# Patient Record
Sex: Female | Born: 1940 | Race: White | Hispanic: No | Smoking: Never smoker
Health system: Southern US, Community
[De-identification: ages and names within clinical notes are randomized; demographics above are authoritative.]

## PROBLEM LIST (undated history)

## (undated) DIAGNOSIS — N183 Chronic kidney disease, stage 3 unspecified: Secondary | ICD-10-CM

## (undated) DIAGNOSIS — T7840XA Allergy, unspecified, initial encounter: Secondary | ICD-10-CM

## (undated) DIAGNOSIS — E039 Hypothyroidism, unspecified: Secondary | ICD-10-CM

## (undated) DIAGNOSIS — E079 Disorder of thyroid, unspecified: Secondary | ICD-10-CM

## (undated) DIAGNOSIS — M1990 Unspecified osteoarthritis, unspecified site: Secondary | ICD-10-CM

## (undated) DIAGNOSIS — D689 Coagulation defect, unspecified: Secondary | ICD-10-CM

## (undated) DIAGNOSIS — R519 Headache, unspecified: Secondary | ICD-10-CM

## (undated) DIAGNOSIS — A6920 Lyme disease, unspecified: Secondary | ICD-10-CM

## (undated) DIAGNOSIS — F329 Major depressive disorder, single episode, unspecified: Secondary | ICD-10-CM

## (undated) DIAGNOSIS — I2699 Other pulmonary embolism without acute cor pulmonale: Secondary | ICD-10-CM

## (undated) DIAGNOSIS — M353 Polymyalgia rheumatica: Secondary | ICD-10-CM

## (undated) DIAGNOSIS — K219 Gastro-esophageal reflux disease without esophagitis: Secondary | ICD-10-CM

## (undated) DIAGNOSIS — K529 Noninfective gastroenteritis and colitis, unspecified: Secondary | ICD-10-CM

## (undated) DIAGNOSIS — I8290 Acute embolism and thrombosis of unspecified vein: Secondary | ICD-10-CM

## (undated) DIAGNOSIS — R51 Headache: Secondary | ICD-10-CM

## (undated) DIAGNOSIS — M797 Fibromyalgia: Secondary | ICD-10-CM

## (undated) DIAGNOSIS — G9050 Complex regional pain syndrome I, unspecified: Secondary | ICD-10-CM

## (undated) DIAGNOSIS — F419 Anxiety disorder, unspecified: Secondary | ICD-10-CM

## (undated) DIAGNOSIS — E785 Hyperlipidemia, unspecified: Secondary | ICD-10-CM

## (undated) DIAGNOSIS — F32A Depression, unspecified: Secondary | ICD-10-CM

## (undated) DIAGNOSIS — M810 Age-related osteoporosis without current pathological fracture: Secondary | ICD-10-CM

## (undated) DIAGNOSIS — I10 Essential (primary) hypertension: Secondary | ICD-10-CM

## (undated) DIAGNOSIS — I809 Phlebitis and thrombophlebitis of unspecified site: Secondary | ICD-10-CM

## (undated) HISTORY — DX: Anxiety disorder, unspecified: F41.9

## (undated) HISTORY — DX: Chronic kidney disease, stage 3 (moderate): N18.3

## (undated) HISTORY — DX: Major depressive disorder, single episode, unspecified: F32.9

## (undated) HISTORY — DX: Hyperlipidemia, unspecified: E78.5

## (undated) HISTORY — DX: Depression, unspecified: F32.A

## (undated) HISTORY — DX: Hypothyroidism, unspecified: E03.9

## (undated) HISTORY — DX: Chronic kidney disease, stage 3 unspecified: N18.30

## (undated) HISTORY — DX: Allergy, unspecified, initial encounter: T78.40XA

## (undated) HISTORY — DX: Coagulation defect, unspecified: D68.9

## (undated) HISTORY — DX: Age-related osteoporosis without current pathological fracture: M81.0

## (undated) HISTORY — PX: THROMBECTOMY / EMBOLECTOMY VENA CAVA: SUR1356

## (undated) HISTORY — DX: Polymyalgia rheumatica: M35.3

## (undated) HISTORY — PX: DILATION AND CURETTAGE OF UTERUS: SHX78

## (undated) HISTORY — DX: Gastro-esophageal reflux disease without esophagitis: K21.9

## (undated) HISTORY — PX: EYE SURGERY: SHX253

## (undated) HISTORY — PX: APPENDECTOMY: SHX54

## (undated) HISTORY — PX: CHOLECYSTECTOMY: SHX55

## (undated) HISTORY — PX: BACK SURGERY: SHX140

## (undated) HISTORY — PX: OTHER SURGICAL HISTORY: SHX169

---

## 1970-09-25 DIAGNOSIS — I809 Phlebitis and thrombophlebitis of unspecified site: Secondary | ICD-10-CM

## 1970-09-25 HISTORY — DX: Phlebitis and thrombophlebitis of unspecified site: I80.9

## 2010-03-22 ENCOUNTER — Inpatient Hospital Stay (HOSPITAL_COMMUNITY): Admission: EM | Admit: 2010-03-22 | Discharge: 2010-03-26 | Payer: Self-pay | Admitting: Emergency Medicine

## 2010-04-04 ENCOUNTER — Ambulatory Visit: Payer: Self-pay | Admitting: Internal Medicine

## 2010-04-04 DIAGNOSIS — K219 Gastro-esophageal reflux disease without esophagitis: Secondary | ICD-10-CM

## 2010-04-04 DIAGNOSIS — J40 Bronchitis, not specified as acute or chronic: Secondary | ICD-10-CM | POA: Insufficient documentation

## 2010-04-05 DIAGNOSIS — I10 Essential (primary) hypertension: Secondary | ICD-10-CM | POA: Insufficient documentation

## 2010-04-05 DIAGNOSIS — I80299 Phlebitis and thrombophlebitis of other deep vessels of unspecified lower extremity: Secondary | ICD-10-CM | POA: Insufficient documentation

## 2010-04-05 DIAGNOSIS — J45909 Unspecified asthma, uncomplicated: Secondary | ICD-10-CM | POA: Insufficient documentation

## 2010-04-11 ENCOUNTER — Encounter: Payer: Self-pay | Admitting: Internal Medicine

## 2010-04-11 ENCOUNTER — Ambulatory Visit (HOSPITAL_COMMUNITY): Admission: RE | Admit: 2010-04-11 | Discharge: 2010-04-11 | Payer: Self-pay | Admitting: Internal Medicine

## 2010-10-25 NOTE — Assessment & Plan Note (Signed)
Summary: ASTHMA/ MBW   Primary Provider/Referring Provider:  Gerri Spore  CC:  Asthma consult-Dr. Gerri Spore.  History of Present Illness: April 04, 2010- 70 yoF seen on kind referral by Dr Gerri Spore for asthma. Here with sister-in-law. Never smoked, but lived with a smoker for 20 years. She had dealt with a chestcold/ bronchitis this Spring. It got worse after a plane flight and never quite cleared with antibiotics. In late June she was hospitalized with bronchitis. Now better but still not fully clear- hoarse, some cough. She is now finishing a prednisone taper. She is getting ready to go to [place] for a couple of months. She reports regular exercise- swimming, yoga- but these have been on hold since the hospital.  She has a nebulizer with albuterol/ ipratropium which she is trying to use 4 x/ daily, but it makes her shake. Given pulmicort inhaler she didn't try because she was afraid  of the steroid.  This week having significant heartburn at the xiphoid. Hx PE age 34 w/ gallbladder surgery, then DVT in 2004 with Lyme Disease= she says she had an inguinal filter placed.  Preventive Screening-Counseling & Management  Alcohol-Tobacco     Smoking Status: never     Passive Smoke Exposure: yes  Comments: Passive smoking during the years she was married.  Current Medications (verified): 1)  Coumadin 5 Mg Tabs (Warfarin Sodium) .... Take As Directed 2)  Levothroid 88 Mcg Tabs (Levothyroxine Sodium) .... Take 1 By Mouth Once Daily 3)  Verapamil Hcl 120 Mg Tabs (Verapamil Hcl) .... Take 1 By Mouth Two Times A Day 4)  Celexa 10 Mg Tabs (Citalopram Hydrobromide) .... Take 30mg  Daily 5)  Albuterol Sulfate (2.5 Mg/61ml) 0.083% Nebu (Albuterol Sulfate) .Marland Kitchen.. 1 Vial Every 6 Hours As Needed 6)  Ipratropium Bromide 0.02 % Soln (Ipratropium Bromide) .Marland Kitchen.. 1 Vial Every 6 Hours As Needed 7)  Prednisone 20 Mg Tabs (Prednisone) .... Taper Dose  Allergies (verified): 1)  ! Codeine 2)  ! * Quin Abx's 3)   ! Bactrim 4)  ! * Ivp Dye  Past History:  Past Medical History: Asthma Deep Vein Thrombosis/Phlebitis - Iliac/ vena cava- PE age 33, DVT 2004 Hypertension  Past Surgical History: Appendectomy Cholecystectomy Tubal ligation Ilieofemoral thrombectomy  Family History: Emphysema-mohther Asthma-sisters Cancer-daughter(breast to brain).  Social History: Divorced; children Lives Alone Retired Non SmokerSmoking Status:  never  Vital Signs:  Patient profile:   70 year old female Weight:      151.31 pounds O2 Sat:      97 % on Room air Pulse rate:   80 / minute BP sitting:   140 / 82  (left arm) Cuff size:   regular  Vitals Entered By: Reynaldo Minium CMA (April 04, 2010 9:14 AM)  O2 Flow:  Room air CC: Asthma consult-Dr. Gerri Spore   Physical Exam  Additional Exam:  General: A/Ox3; pleasant and cooperative, NAD, SKIN: no rash, lesions NODES: no lymphadenopathy HEENT: [place]/AT, EOM- WNL, Conjuctivae- clear, PERRLA, TM-WNL, Nose- crusty, Throat- clear and wnl, Mallampati  II, red NECK: Supple w/ fair ROM, JVD- none, normal carotid impulses w/o bruits Thyroid- normal to palpation CHEST: Clear to P&A, but occasional rattling cough HEART: RRR, no m/g/r heard ABDOMEN: Soft and nl; nml bowel sounds; no organomegaly or masses noted UXL:KGMW, nl pulses, no edema  NEURO: Grossly intact to observation      Impression & Recommendations:  Problem # 1:  BRONCHITIS NOT SPECIFIED AS ACUTE OR CHRONIC (ICD-490)  Acute on chronic bronchitis with past  hx of passive smoking. We are going to have her take the Pulmicort HFA and also try Singulair as antiinlammatories. Meanwhile we will schedule PFT in hopes of getting it before she goes to [place]. We plan to see her again in the Fall. By that time her lungs should have settled down. Her updated medication list for this problem includes:    Albuterol Sulfate (2.5 Mg/46ml) 0.083% Nebu (Albuterol sulfate) .Marland Kitchen... 1 vial every 6 hours as  needed    Ipratropium Bromide 0.02 % Soln (Ipratropium bromide) .Marland Kitchen... 1 vial every 6 hours as needed    Pulmicort Flexhaler 180 Mcg/act Aepb (Budesonide) .Marland Kitchen... 2 puffs and rinse mouth twice daily    Singulair 10 Mg Tabs (Montelukast sodium) .Marland Kitchen... 1 daily  Problem # 2:  GERD (ICD-530.81) I emphasized her GERD. She will start a trial of omeprazole. This problem may ease as she gets off prednisone. Her updated medication list for this problem includes:    Omeprazole 20 Mg Cpdr (Omeprazole) .Marland Kitchen... 1 daily before meal  Medications Added to Medication List This Visit: 1)  Coumadin 5 Mg Tabs (Warfarin sodium) .... Take as directed 2)  Levothroid 88 Mcg Tabs (Levothyroxine sodium) .... Take 1 by mouth once daily 3)  Verapamil Hcl 120 Mg Tabs (Verapamil hcl) .... Take 1 by mouth two times a day 4)  Celexa 10 Mg Tabs (Citalopram hydrobromide) .... Take 30mg  daily 5)  Albuterol Sulfate (2.5 Mg/48ml) 0.083% Nebu (Albuterol sulfate) .Marland Kitchen.. 1 vial every 6 hours as needed 6)  Ipratropium Bromide 0.02 % Soln (Ipratropium bromide) .Marland Kitchen.. 1 vial every 6 hours as needed 7)  Prednisone 20 Mg Tabs (Prednisone) .... Taper dose 8)  Omeprazole 20 Mg Cpdr (Omeprazole) .Marland Kitchen.. 1 daily before meal 9)  Pulmicort Flexhaler 180 Mcg/act Aepb (Budesonide) .... 2 puffs and rinse mouth twice daily 10)  Singulair 10 Mg Tabs (Montelukast sodium) .Marland Kitchen.. 1 daily  Other Orders: Consultation Level IV (16109)  Patient Instructions: 1)  Please schedule a follow-up appointment in 3 months. 2)  Schedule PFT 3)  Sample/ script Singulair 10 mg, 1 daily. If it seems to help you can fill script. 4)  Pulmicort 180: 2 puffs and rinse mouth, twice daily . If doing very well, after 2 weeks you can reduce to once daily. 5)  Omeprazole acid blocker- one daily before meal. 6)  Use your nebulizer up to 4 x daily, if needed.  7)  Use the Proventil rescue inhaler 2 puffs, 4 x daily if needed. Prescriptions: SINGULAIR 10 MG TABS (MONTELUKAST SODIUM) 1  daily  #30 x prn   Entered and Authorized by:   Waymon Budge MD   Signed by:   Waymon Budge MD on 04/04/2010   Method used:   Print then Give to Patient   RxID:   [id number] PULMICORT FLEXHALER 180 MCG/ACT AEPB (BUDESONIDE) 2 puffs and rinse mouth twice daily  #1 x prn   Entered and Authorized by:   Waymon Budge MD   Signed by:   Waymon Budge MD on 04/04/2010   Method used:   Print then Give to Patient   RxID:   [id number] OMEPRAZOLE 20 MG CPDR (OMEPRAZOLE) 1 daily before meal  #30 x prn   Entered and Authorized by:   Waymon Budge MD   Signed by:   Waymon Budge MD on 04/04/2010   Method used:   Print then Give to Patient   RxID:   [id number]

## 2010-12-11 LAB — COMPREHENSIVE METABOLIC PANEL
ALT: 18 U/L (ref 0–35)
BUN: 21 mg/dL (ref 6–23)
Calcium: 8.9 mg/dL (ref 8.4–10.5)
Chloride: 105 mEq/L (ref 96–112)
GFR calc Af Amer: >60 mL/min (ref >60)
Glucose, Bld: 153 mg/dL — ABNORMAL HIGH (ref 70–99)
Potassium: 4.2 mEq/L (ref 3.5–5.1)
Sodium: 139 mEq/L (ref 135–145)

## 2010-12-11 LAB — GLUCOSE, CAPILLARY
Glucose-Capillary: 122 mg/dL — ABNORMAL HIGH (ref 70–99)
Glucose-Capillary: 152 mg/dL — ABNORMAL HIGH (ref 70–99)
Glucose-Capillary: 157 mg/dL — ABNORMAL HIGH (ref 70–99)

## 2010-12-11 LAB — CBC
HCT: 35.4 % — ABNORMAL LOW (ref 36.0–46.0)
Hemoglobin: 12 g/dL (ref 12.0–15.0)
MCV: 94.1 fL (ref 78.0–100.0)
Platelets: 266 10*3/uL (ref 150–400)

## 2010-12-11 LAB — PROTIME-INR: INR: 2.51 — ABNORMAL HIGH (ref 0.00–1.49)

## 2010-12-12 LAB — CULTURE, BLOOD (ROUTINE X 2): Culture: NO GROWTH

## 2010-12-12 LAB — CARDIAC PANEL(CRET KIN+CKTOT+MB+TROPI)
CK, MB: 1.2 ng/mL (ref 0.3–4.0)
CK, MB: 1.5 ng/mL (ref 0.3–4.0)
Relative Index: INVALID (ref 0.0–2.5)
Total CK: 25 U/L (ref 7–177)
Total CK: 30 U/L (ref 7–177)
Total CK: 32 U/L (ref 7–177)

## 2010-12-12 LAB — BASIC METABOLIC PANEL
BUN: 15 mg/dL (ref 6–23)
CO2: 28 mEq/L (ref 19–32)
Chloride: 105 mEq/L (ref 96–112)
Creatinine, Ser: 0.77 mg/dL (ref 0.4–1.2)

## 2010-12-12 LAB — CBC
HCT: 39.7 % (ref 36.0–46.0)
MCH: 31.5 pg (ref 26.0–34.0)
MCH: 31.8 pg (ref 26.0–34.0)
MCHC: 33.6 g/dL (ref 30.0–36.0)
MCV: 93.8 fL (ref 78.0–100.0)
MCV: 93.9 fL (ref 78.0–100.0)
Platelets: 246 10*3/uL (ref 150–400)
RBC: 4.23 MIL/uL (ref 3.87–5.11)
RDW: 15.2 % (ref 11.5–15.5)
WBC: 6.1 10*3/uL (ref 4.0–10.5)

## 2010-12-12 LAB — BLOOD GAS, ARTERIAL
Bicarbonate: 20.5 mEq/L (ref 20.0–24.0)
Patient temperature: 98.6
TCO2: 18.6 mmol/L (ref 0–100)
pCO2 arterial: 36.4 mmHg (ref 35.0–45.0)
pH, Arterial: 7.368 (ref 7.350–7.400)

## 2010-12-12 LAB — COMPREHENSIVE METABOLIC PANEL
ALT: 19 U/L (ref 0–35)
AST: 20 U/L (ref 0–37)
CO2: 25 mEq/L (ref 19–32)
Chloride: 104 mEq/L (ref 96–112)
Glucose, Bld: 132 mg/dL — ABNORMAL HIGH (ref 70–99)
Potassium: 3.2 mEq/L — ABNORMAL LOW (ref 3.5–5.1)
Total Protein: 6.6 g/dL (ref 6.0–8.3)

## 2010-12-12 LAB — GLUCOSE, CAPILLARY

## 2010-12-12 LAB — URINE CULTURE: Culture: NO GROWTH

## 2010-12-12 LAB — DIFFERENTIAL
Basophils Absolute: 0 10*3/uL (ref 0.0–0.1)
Basophils Relative: 0 % (ref 0–1)
Eosinophils Absolute: 0.1 10*3/uL (ref 0.0–0.7)
Eosinophils Relative: 1 % (ref 0–5)
Lymphs Abs: 2.8 10*3/uL (ref 0.7–4.0)
Neutro Abs: 2.8 10*3/uL (ref 1.7–7.7)

## 2010-12-12 LAB — URINALYSIS, ROUTINE W REFLEX MICROSCOPIC
Ketones, ur: NEGATIVE mg/dL
Leukocytes, UA: NEGATIVE
Specific Gravity, Urine: 1.01 (ref 1.005–1.030)
pH: 6 (ref 5.0–8.0)

## 2010-12-12 LAB — PROTIME-INR
INR: 1.96 — ABNORMAL HIGH (ref 0.00–1.49)
INR: 2.23 — ABNORMAL HIGH (ref 0.00–1.49)
Prothrombin Time: 24.5 seconds — ABNORMAL HIGH (ref 11.6–15.2)

## 2010-12-12 LAB — LIPID PANEL
Triglycerides: 47 mg/dL (ref <150)
VLDL: 9 mg/dL (ref 0–40)

## 2010-12-12 LAB — BRAIN NATRIURETIC PEPTIDE: Pro B Natriuretic peptide (BNP): <30 pg/mL (ref 0.0–100.0)

## 2010-12-12 LAB — URINE MICROSCOPIC-ADD ON

## 2010-12-12 LAB — LACTIC ACID, PLASMA: Lactic Acid, Venous: 2.2 mmol/L (ref 0.5–2.2)

## 2010-12-12 LAB — HEMOGLOBIN A1C: Mean Plasma Glucose: 128 mg/dL — ABNORMAL HIGH (ref <117)

## 2010-12-30 ENCOUNTER — Emergency Department (HOSPITAL_BASED_OUTPATIENT_CLINIC_OR_DEPARTMENT_OTHER)
Admission: EM | Admit: 2010-12-30 | Discharge: 2010-12-30 | Disposition: A | Discharge status: Other Acute Inpatient Hospital | Payer: Medicare Other | Source: Home / Self Care | Attending: Emergency Medicine | Admitting: Emergency Medicine

## 2010-12-30 ENCOUNTER — Inpatient Hospital Stay (HOSPITAL_COMMUNITY)
Admission: EM | Admit: 2010-12-30 | Discharge: 2011-01-02 | DRG: 690 | Disposition: A | Discharge status: Home / Self Care | Payer: Medicare Other | Source: Other Acute Inpatient Hospital | Attending: Internal Medicine | Admitting: Internal Medicine

## 2010-12-30 ENCOUNTER — Emergency Department (INDEPENDENT_AMBULATORY_CARE_PROVIDER_SITE_OTHER): Payer: Medicare Other

## 2010-12-30 DIAGNOSIS — F411 Generalized anxiety disorder: Secondary | ICD-10-CM | POA: Diagnosis present

## 2010-12-30 DIAGNOSIS — R1032 Left lower quadrant pain: Secondary | ICD-10-CM

## 2010-12-30 DIAGNOSIS — J45909 Unspecified asthma, uncomplicated: Secondary | ICD-10-CM | POA: Insufficient documentation

## 2010-12-30 DIAGNOSIS — I998 Other disorder of circulatory system: Secondary | ICD-10-CM | POA: Insufficient documentation

## 2010-12-30 DIAGNOSIS — IMO0001 Reserved for inherently not codable concepts without codable children: Secondary | ICD-10-CM | POA: Diagnosis present

## 2010-12-30 DIAGNOSIS — R112 Nausea with vomiting, unspecified: Secondary | ICD-10-CM

## 2010-12-30 DIAGNOSIS — E785 Hyperlipidemia, unspecified: Secondary | ICD-10-CM | POA: Diagnosis present

## 2010-12-30 DIAGNOSIS — R109 Unspecified abdominal pain: Secondary | ICD-10-CM

## 2010-12-30 DIAGNOSIS — I82509 Chronic embolism and thrombosis of unspecified deep veins of unspecified lower extremity: Secondary | ICD-10-CM | POA: Diagnosis present

## 2010-12-30 DIAGNOSIS — Z79899 Other long term (current) drug therapy: Secondary | ICD-10-CM | POA: Insufficient documentation

## 2010-12-30 DIAGNOSIS — E039 Hypothyroidism, unspecified: Secondary | ICD-10-CM | POA: Diagnosis present

## 2010-12-30 DIAGNOSIS — N12 Tubulo-interstitial nephritis, not specified as acute or chronic: Secondary | ICD-10-CM | POA: Insufficient documentation

## 2010-12-30 DIAGNOSIS — N10 Acute tubulo-interstitial nephritis: Principal | ICD-10-CM | POA: Diagnosis present

## 2010-12-30 DIAGNOSIS — Z86718 Personal history of other venous thrombosis and embolism: Secondary | ICD-10-CM | POA: Insufficient documentation

## 2010-12-30 DIAGNOSIS — I10 Essential (primary) hypertension: Secondary | ICD-10-CM | POA: Insufficient documentation

## 2010-12-30 DIAGNOSIS — D72829 Elevated white blood cell count, unspecified: Secondary | ICD-10-CM | POA: Diagnosis present

## 2010-12-30 DIAGNOSIS — Z87442 Personal history of urinary calculi: Secondary | ICD-10-CM

## 2010-12-30 DIAGNOSIS — Z7901 Long term (current) use of anticoagulants: Secondary | ICD-10-CM

## 2010-12-30 DIAGNOSIS — G9050 Complex regional pain syndrome I, unspecified: Secondary | ICD-10-CM | POA: Diagnosis present

## 2010-12-30 LAB — CBC
MCH: 30.8 pg (ref 26.0–34.0)
MCV: 88.8 fL (ref 78.0–100.0)
Platelets: 264 10*3/uL (ref 150–400)
RBC: 5.36 MIL/uL — ABNORMAL HIGH (ref 3.87–5.11)

## 2010-12-30 LAB — URINALYSIS, ROUTINE W REFLEX MICROSCOPIC
Nitrite: POSITIVE — AB
Protein, ur: 30 mg/dL — AB
Specific Gravity, Urine: 1.014 (ref 1.005–1.030)
Urobilinogen, UA: 1 mg/dL (ref 0.0–1.0)

## 2010-12-30 LAB — BASIC METABOLIC PANEL
BUN: 25 mg/dL — ABNORMAL HIGH (ref 6–23)
Chloride: 96 mEq/L (ref 96–112)
Creatinine, Ser: 0.9 mg/dL (ref 0.4–1.2)
Glucose, Bld: 137 mg/dL — ABNORMAL HIGH (ref 70–99)

## 2010-12-30 LAB — URINE MICROSCOPIC-ADD ON

## 2010-12-30 LAB — DIFFERENTIAL
Basophils Relative: 0 % (ref 0–1)
Eosinophils Absolute: 0 10*3/uL (ref 0.0–0.7)
Monocytes Absolute: 4.6 10*3/uL — ABNORMAL HIGH (ref 0.1–1.0)
Neutro Abs: 28.6 10*3/uL — ABNORMAL HIGH (ref 1.7–7.7)

## 2010-12-30 LAB — PROTIME-INR: Prothrombin Time: 54.6 seconds — ABNORMAL HIGH (ref 11.6–15.2)

## 2010-12-31 LAB — CBC
HCT: 40.7 % (ref 36.0–46.0)
MCV: 91.1 fL (ref 78.0–100.0)
Platelets: 185 10*3/uL (ref 150–400)
RBC: 4.47 MIL/uL (ref 3.87–5.11)
RDW: 13 % (ref 11.5–15.5)
WBC: 24 10*3/uL — ABNORMAL HIGH (ref 4.0–10.5)

## 2010-12-31 LAB — BASIC METABOLIC PANEL
Calcium: 8.4 mg/dL (ref 8.4–10.5)
Chloride: 103 mEq/L (ref 96–112)
Creatinine, Ser: 0.93 mg/dL (ref 0.4–1.2)
GFR calc Af Amer: >60 mL/min (ref >60)
Sodium: 137 mEq/L (ref 135–145)

## 2010-12-31 LAB — TSH: TSH: 0.051 u[IU]/mL — ABNORMAL LOW (ref 0.350–4.500)

## 2010-12-31 LAB — LIPID PANEL
HDL: 67 mg/dL (ref >39)
Total CHOL/HDL Ratio: 2.2 RATIO
VLDL: 22 mg/dL (ref 0–40)

## 2010-12-31 LAB — PROTIME-INR: INR: 6.79 (ref 0.00–1.49)

## 2010-12-31 LAB — HEMOGLOBIN A1C
Hgb A1c MFr Bld: 5.9 % — ABNORMAL HIGH (ref <5.7)
Mean Plasma Glucose: 123 mg/dL — ABNORMAL HIGH (ref <117)

## 2010-12-31 LAB — PHOSPHORUS: Phosphorus: 3.9 mg/dL (ref 2.3–4.6)

## 2011-01-01 LAB — CBC
Hemoglobin: 12.8 g/dL (ref 12.0–15.0)
MCH: 30.8 pg (ref 26.0–34.0)
MCHC: 33.2 g/dL (ref 30.0–36.0)
Platelets: 182 10*3/uL (ref 150–400)
RDW: 13.3 % (ref 11.5–15.5)

## 2011-01-01 LAB — ABO/RH: ABO/RH(D): A NEG

## 2011-01-01 LAB — DIFFERENTIAL
Basophils Relative: 0 % (ref 0–1)
Eosinophils Absolute: 0 10*3/uL (ref 0.0–0.7)
Eosinophils Relative: 0 % (ref 0–5)
Monocytes Absolute: 1.8 10*3/uL — ABNORMAL HIGH (ref 0.1–1.0)
Monocytes Relative: 9 % (ref 3–12)

## 2011-01-01 LAB — COMPREHENSIVE METABOLIC PANEL
Alkaline Phosphatase: 53 U/L (ref 39–117)
BUN: 15 mg/dL (ref 6–23)
Creatinine, Ser: 0.76 mg/dL (ref 0.4–1.2)
Glucose, Bld: 128 mg/dL — ABNORMAL HIGH (ref 70–99)
Potassium: 4.5 mEq/L (ref 3.5–5.1)
Total Protein: 5.4 g/dL — ABNORMAL LOW (ref 6.0–8.3)

## 2011-01-01 LAB — MAGNESIUM: Magnesium: 2.2 mg/dL (ref 1.5–2.5)

## 2011-01-01 LAB — LIPASE, BLOOD: Lipase: 35 U/L (ref 11–59)

## 2011-01-01 LAB — URINE CULTURE
Culture  Setup Time: 201204070059
Culture: NO GROWTH

## 2011-01-01 LAB — PROTIME-INR: Prothrombin Time: 49.6 seconds — ABNORMAL HIGH (ref 11.6–15.2)

## 2011-01-02 LAB — BASIC METABOLIC PANEL
BUN: 12 mg/dL (ref 6–23)
Chloride: 102 mEq/L (ref 96–112)
GFR calc Af Amer: >60 mL/min (ref >60)
GFR calc non Af Amer: >60 mL/min (ref >60)
Potassium: 3.5 mEq/L (ref 3.5–5.1)
Sodium: 137 mEq/L (ref 135–145)

## 2011-01-02 LAB — CBC
Platelets: 177 10*3/uL (ref 150–400)
RBC: 4 MIL/uL (ref 3.87–5.11)
RDW: 13.1 % (ref 11.5–15.5)
WBC: 18.6 10*3/uL — ABNORMAL HIGH (ref 4.0–10.5)

## 2011-01-02 LAB — DIFFERENTIAL
Basophils Relative: 0 % (ref 0–1)
Eosinophils Absolute: 0 10*3/uL (ref 0.0–0.7)
Eosinophils Relative: 0 % (ref 0–5)
Monocytes Absolute: 1.6 10*3/uL — ABNORMAL HIGH (ref 0.1–1.0)
Monocytes Relative: 9 % (ref 3–12)

## 2011-01-02 LAB — MAGNESIUM: Magnesium: 2.1 mg/dL (ref 1.5–2.5)

## 2011-01-02 LAB — PREPARE FRESH FROZEN PLASMA: Unit division: 0

## 2011-01-05 LAB — CULTURE, BLOOD (ROUTINE X 2): Culture  Setup Time: 201204062325

## 2011-01-06 LAB — CULTURE, BLOOD (ROUTINE X 2)
Culture  Setup Time: 201204071121
Culture  Setup Time: 201204071121

## 2011-01-09 NOTE — Discharge Summary (Signed)
NAME:  Elizabeth [name], Elizabeth [name]              ACCOUNT NO.:  [id number]  MEDICAL RECORD NO.:  [id number]           PATIENT TYPE:  I  LOCATION:  5532                         FACILITY:  [facility]  PHYSICIAN:  Rock Nephew, MD       DATE OF BIRTH:  [birth year]  DATE OF ADMISSION:  12/30/2010 DATE OF DISCHARGE:  01/02/2011                        DISCHARGE SUMMARY - REFERRING   PRIMARY CARE PHYSICIAN:  Dr.  Carolin Coy at [facility].  DISCHARGE DIAGNOSES: 1. Acute left-sided pyelonephritis. 2. History of pulmonary embolism and deep vein thrombosis, on     Coumadin.  Coagulopathy and hematuria resolved. 3. History of asthma, stable. 4. Leukocytosis, resolving, combination of infection as well as     steroid use. 5. Hyperlipidemia, stable. 6. Hypothyroidism with a depressed TSH, normal free T4. 7. Hypertension, controlled. 8. Depression. 9. Reflex sympathetic dystrophy. 10.Hematuria.  DISCHARGE MEDICATIONS:  For the patient are as follows: 1. Augmentin 875 mg by mouth twice daily for 11 days. 2. Colace 100 mg by mouth twice daily. 3. Dilaudid oral 2 mg by mouth every 4 hours as needed for pain. 4. Zofran 4 mg by mouth every 6 hours as needed. 5. Prednisone taper 20 mg for 2 days, 10 mg for 2 days and stopping. 6. Albuterol nebulization 2.5 mg inhaled every 6 hours as needed. 7. Benzoate 100 mg 1 capsule by mouth 3 times a day as needed. 8. Budesonide 0.5 mg inhaled every 12 hours as needed. 9. Citalopram 30 mg 1 tablet by mouth daily. 10.Coumadin 5 mg.  The patient takes 1 mg on Monday, Wednesday, and     Friday.  She takes half tablet rest of the week. 11.Atrovent 0.5 mg inhaled every 6 hours as needed. 12.Levothroid 88 mcg 1 tablet p.o. daily. 13.Verapamil sustained release 120 mg 1 tablet by mouth twice daily.  DISPOSITION:  Home.  DIET:  Heart healthy.  FOLLOW-UP:  The patient should follow up with Dr. Carolin Coy within 1 week.  The patient should also have  PT/INR checked 2 days after discharge with Dr. Carolin Coy.  The patient should also have thyroid function test done in 5 weeks.  The patient should also continue to follow up with the orthopedic physician.  PROCEDURES PERFORMED:  The patient had a 2-view abdominal x-ray which showed no acute abnormalities.  The patient had a CT scan of the abdomen and pelvis which showed no renal, ureteral, bladder calculi are seen, mild left perinephric stranding nonspecific.  Given the clinical history of pyelonephritis, less likely recent passage of renal stone calculus suspected.  BRIEF HISTORY OF PRESENT ILLNESS:  Chief complaint was left flank pain with nausea and vomiting.  This is a 70 year old female who is on chronic prednisone for asthma and fibromyalgia reflex sympathetic dystrophy presenting with left flank pain.  The patient also with left flank pain associated with vomiting, no diarrhea.  The patient denied any chills but she had some fevers.  She denies any shortness of breath.  HOSPITAL COURSE: 1. Acute pyelonephritis.  Patient had acute pyelonephritis.  Patient     had multiple cultures drawn; however, the cultures were negative  blood cultures, urine cultures.  The patient, however, most likely     still has pyelonephritis.  The patient had leukocytosis and some     fevers.  The patient's WBC count has improved with antibiotics.     The patient will be discharged on Augmentin for 11 days to complete     a 14-day course of antibiotics.  The patient cannot take a     quinolone or she cannot take Bactrim because of allergies. 2. History of PE, DVT.  The patient is on warfarin.  The patient     initially came in supratherapeutic.  The patient reports she has     not been feeling well lately.  She says she has not had her     Coumadin level checked.  The patient's current INR is 1.79. 3. Coagulopathy, hematuria.  The patient had some coagulopathy with     hematuria.  The  patient's INR was supratherapeutic.  The patient     received 1 unit of FFP.  The patient's INR dropped from a 5.4 to     1.79.  The patient will be given an extra dose of Coumadin 7.5 mg     on January 02, 2011, for discharge. 4. History of asthma.  The patient's asthma is stable. 5. Leukocytosis.  The patient had leukocytosis, initially presented     with the WBC count of 35.0.  The patient's leukocytosis has gone     down to 18.6 on January 02, 2011, with leukocytosis with combination     of infection as well as chronic prednisone therapy.  Also of note,     the patient is no longer interested in taking prednisone.     Prednisone was mostly for reflex sympathetic dystrophy that the     patient have.  The patient will be tapered off prednisone. 6. Hyperlipidemia.  The patient is stable.  The patient does not     appear to take a statin at home.  The patient had LDL at 59. 7. Hypothyroidism.  The patient had depressed TSH at 0.051.  The     patient's free T4 was normal at 1.25.  The patient will be     continued on levothyroxine.  The patient should have repeat thyroid     function test done in about 5 weeks. 8. Hypertension.  The patient received IV fluids.  Later during the     hospitalization, the patient's blood pressure became elevated.  The     patient's fluids were cut down and the patient was started back on     verapamil.  Current blood pressure of the patient is 112/71, it is     from hypertension. 9. Depression.  The patient's depression has been stable.  Also of     note, the patient's citalopram should be     decreased to 20 mg p.o. daily. 10.Reflex sympathetic dystrophy.  The patient is taking pain     medications.  To follow up with Orthopedics. 11.Please also note that the patient has prediabetes and the     hemoglobin A1c was 5.9.     Rock Nephew, MD     NH/MEDQ  D:  01/02/2011  T:  01/02/2011  Job:  [id number]  cc:   Otilio Connors. Gerri Spore, M.D.  Electronically  Signed by Rock Nephew MD on 01/09/2011 09:48:37 PM

## 2011-01-10 NOTE — H&P (Signed)
NAME:  Elizabeth [name], Elizabeth [name]              ACCOUNT NO.:  [id number]  MEDICAL RECORD NO.:  [id number]           PATIENT TYPE:  I  LOCATION:  5532                         FACILITY:  [facility]  PHYSICIAN:  Lonia Blood, M.D.      DATE OF BIRTH:  Apr 25, 1941  DATE OF ADMISSION:  12/30/2010 DATE OF DISCHARGE:                             HISTORY & PHYSICAL   PRIMARY CARE PHYSICIAN:  Carola J. Gerri Spore, MD of [facility].  PRESENTING COMPLAINT:  Left flank pain, nausea, vomiting.  HISTORY OF PRESENT ILLNESS:  The patient is a 70 year old female who is on chronic prednisone for asthma and fibromyalgia presenting with left flank pain.  This started yesterday associated with vomiting.  No diarrhea.  No abdominal pain.  The patient said the pain is so severe it is unlike what she has ever been used to.  She denied any chills but had some fever.  Denied any shortness of breath.  She was so worried that she decided to go to the emergency room.  PAST MEDICAL HISTORY:  Significant for anxiety disorder, hypertension, fibromyalgia, hypothyroidism, history of Lyme disease, history of pulmonary embolism with inferior vena caval thrombectomy.  She has been on Coumadin since she was 74, history of DVT also when she was 29, depression, history of steroid-induced hyperglycemia, hyperlipidemia, history of RSD.  ALLERGIES:  CODEINE, IVP DYE, and LEVAQUIN.  MEDICATIONS:  Citalopram, levothyroxine, Percocet, phenazopyridine, prednisone, simvastatin, verapamil, and Coumadin.  SOCIAL HISTORY:  The patient lives in [place].  She denied tobacco, alcohol, or IV drug use.  FAMILY HISTORY:  Daughter died from brain cancer.  Otherwise, no other significant past medical history.  REVIEW OF SYSTEMS:  All systems reviewed are currently negative except per HPI.  PHYSICAL EXAMINATION:  VITAL SIGNS:  Temperature is 99, blood pressure 124/79, pulse 87, respiratory rate 21, sats 100% on room air. GENERAL:   The patient is pleasant, awake, alert, oriented.  She is in no acute distress. HEENT:  PERRLA.  EOMI.  No pallor, no jaundice, no rhinorrhea. NECK:  Supple.  No JVD.  No lymphadenopathy. RESPIRATORY:  She has good air entry bilaterally.  No wheezes.  No rales.  No crackles. CARDIOVASCULAR:  She has S1, S2.  No audible murmur. ABDOMEN:  Soft full, nontender with positive bowel sounds.  She has positive CVA angle tenderness. SKIN:  No rashes.  No ulcers. MUSCULOSKELETAL:  No joint swelling or tenderness.  LABORATORY DATA:  Sodium is 139, potassium 4.1, chloride 96, CO2 of 27, glucose 137, BUN 25, creatinine 0.9, and calcium 9.2.  Urinalysis showed cloudy orange urine with small ketones, large blood, proteinuria, positive nitrite, and moderate leukocyte esterase.  Urine microscopy shows wbc's 11-20, rbc's too numerous to count, rare bacteria.  White count is 35,000 with a left shift.  ANC of 28.6.  Her hemoglobin is 16.5, platelet count 264.  PT 54.6, INR 6.21.  CT abdomen and pelvis showed no renal, ureteral, or bladder calculi.  There is mild left perinephric stranding, nonspecific.  This is consistent with pyelonephritis or recent passage of renal calculus.  Acute abdominal series showed no acute abnormalities.  ASSESSMENT:  This is a 70 year old female presenting with left-sided cerebrovascular accident tenderness, perinephric stranding, fever, and leukocytosis.  More than likely the patient has acute pyelonephritis. The other possibilities is she had a kidney stone that has past, but that would not explain continued pain at this point.  PLAN: 1. Acute pyelo, admit the patient, get blood cultures and urine     cultures.  She is allergic to QUINOLONES, so I will put her on IV     Rocephin 1 g daily, hydrate the patient, pain control.  We will     await urine culture and sensitivity to determine any change if     possible in her antibiotics. 2. Coumadin coagulopathy.  We will hold  the Coumadin for now.  She is     not bleeding from any site except for the hematuria.  With this in     mind, we will watch her closely.  Once the Coumadin falls to around     2, we will resume her Coumadin. 3. History of asthma.  She is not wheezing.  I will put her on empiric     nebulizers as needed, and continue with her home medicine. 4. Leukocytosis more than likely this is a combination of infection,     but also she is on steroids.  We will watch closely the duration     her white count will take. 5. Hyperlipidemia.  Continue with simvastatin. 6. Hypothyroidism.  Again, continue with levothyroxine. 7. Hypertension, blood pressure seems reasonable.  We will continue     with home medicine. 8. Depression, anxiety.  She seems stable.  We will continue her home     medicine. 9. Fibromyalgia, mainly pain control as per home. 10.History of RSD.  Again, this is major pain control.  The patient is     scheduled for apparently some nerve blocks soon.  Further treatment     depend on the patient's response to these initial measures.     Lonia Blood, M.D.     Verlin Grills  D:  12/30/2010  T:  12/30/2010  Job:  [id number]  Electronically Signed by Lonia Blood M.D. on 01/10/2011 04:08:11 PM

## 2011-04-06 ENCOUNTER — Emergency Department (HOSPITAL_COMMUNITY)
Admission: EM | Admit: 2011-04-06 | Discharge: 2011-04-07 | Disposition: A | Discharge status: Home / Self Care | Payer: Medicare Other | Attending: Emergency Medicine | Admitting: Emergency Medicine

## 2011-04-06 ENCOUNTER — Emergency Department (HOSPITAL_COMMUNITY): Payer: Medicare Other

## 2011-04-06 DIAGNOSIS — I10 Essential (primary) hypertension: Secondary | ICD-10-CM | POA: Insufficient documentation

## 2011-04-06 DIAGNOSIS — Z86718 Personal history of other venous thrombosis and embolism: Secondary | ICD-10-CM | POA: Insufficient documentation

## 2011-04-06 DIAGNOSIS — M25559 Pain in unspecified hip: Secondary | ICD-10-CM | POA: Insufficient documentation

## 2011-04-06 DIAGNOSIS — Z7901 Long term (current) use of anticoagulants: Secondary | ICD-10-CM | POA: Insufficient documentation

## 2011-04-06 DIAGNOSIS — J45909 Unspecified asthma, uncomplicated: Secondary | ICD-10-CM | POA: Insufficient documentation

## 2011-04-06 DIAGNOSIS — E039 Hypothyroidism, unspecified: Secondary | ICD-10-CM | POA: Insufficient documentation

## 2011-04-06 DIAGNOSIS — M79609 Pain in unspecified limb: Secondary | ICD-10-CM | POA: Insufficient documentation

## 2011-04-06 DIAGNOSIS — Z79899 Other long term (current) drug therapy: Secondary | ICD-10-CM | POA: Insufficient documentation

## 2011-04-06 DIAGNOSIS — F341 Dysthymic disorder: Secondary | ICD-10-CM | POA: Insufficient documentation

## 2011-04-06 LAB — BASIC METABOLIC PANEL
CO2: 31 mEq/L (ref 19–32)
Calcium: 9.2 mg/dL (ref 8.4–10.5)
GFR calc non Af Amer: >60 mL/min (ref >60)
Potassium: 3.9 mEq/L (ref 3.5–5.1)
Sodium: 139 mEq/L (ref 135–145)

## 2011-04-06 LAB — DIFFERENTIAL
Basophils Absolute: 0 10*3/uL (ref 0.0–0.1)
Eosinophils Relative: 4 % (ref 0–5)
Lymphocytes Relative: 41 % (ref 12–46)
Lymphs Abs: 2.6 10*3/uL (ref 0.7–4.0)
Neutro Abs: 2.9 10*3/uL (ref 1.7–7.7)
Neutrophils Relative %: 45 % (ref 43–77)

## 2011-04-06 LAB — CBC
HCT: 35 % — ABNORMAL LOW (ref 36.0–46.0)
RBC: 3.71 MIL/uL — ABNORMAL LOW (ref 3.87–5.11)
RDW: 14.1 % (ref 11.5–15.5)
WBC: 6.4 10*3/uL (ref 4.0–10.5)

## 2011-04-06 LAB — PROTIME-INR: INR: 1.51 — ABNORMAL HIGH (ref 0.00–1.49)

## 2011-04-07 ENCOUNTER — Ambulatory Visit (HOSPITAL_COMMUNITY)
Admission: RE | Admit: 2011-04-07 | Discharge: 2011-04-07 | Disposition: A | Discharge status: Home / Self Care | Payer: Medicare Other | Source: Ambulatory Visit | Attending: Emergency Medicine | Admitting: Emergency Medicine

## 2011-04-07 DIAGNOSIS — M79609 Pain in unspecified limb: Secondary | ICD-10-CM

## 2011-10-03 DIAGNOSIS — H2510 Age-related nuclear cataract, unspecified eye: Secondary | ICD-10-CM | POA: Diagnosis not present

## 2011-10-03 DIAGNOSIS — H01009 Unspecified blepharitis unspecified eye, unspecified eyelid: Secondary | ICD-10-CM | POA: Diagnosis not present

## 2011-10-03 DIAGNOSIS — H00029 Hordeolum internum unspecified eye, unspecified eyelid: Secondary | ICD-10-CM | POA: Diagnosis not present

## 2011-10-17 DIAGNOSIS — M2550 Pain in unspecified joint: Secondary | ICD-10-CM | POA: Diagnosis not present

## 2011-10-17 DIAGNOSIS — Z7901 Long term (current) use of anticoagulants: Secondary | ICD-10-CM | POA: Diagnosis not present

## 2011-10-17 DIAGNOSIS — H538 Other visual disturbances: Secondary | ICD-10-CM | POA: Diagnosis not present

## 2011-10-17 DIAGNOSIS — F411 Generalized anxiety disorder: Secondary | ICD-10-CM | POA: Diagnosis not present

## 2011-10-17 DIAGNOSIS — I2699 Other pulmonary embolism without acute cor pulmonale: Secondary | ICD-10-CM | POA: Diagnosis not present

## 2011-10-17 DIAGNOSIS — M545 Low back pain: Secondary | ICD-10-CM | POA: Diagnosis not present

## 2011-10-17 DIAGNOSIS — T81718A Complication of other artery following a procedure, not elsewhere classified, initial encounter: Secondary | ICD-10-CM | POA: Diagnosis not present

## 2011-10-17 DIAGNOSIS — J45901 Unspecified asthma with (acute) exacerbation: Secondary | ICD-10-CM | POA: Diagnosis not present

## 2011-11-28 DIAGNOSIS — M545 Low back pain: Secondary | ICD-10-CM | POA: Diagnosis not present

## 2011-11-28 DIAGNOSIS — F411 Generalized anxiety disorder: Secondary | ICD-10-CM | POA: Diagnosis not present

## 2011-11-28 DIAGNOSIS — I2699 Other pulmonary embolism without acute cor pulmonale: Secondary | ICD-10-CM | POA: Diagnosis not present

## 2011-11-28 DIAGNOSIS — Z7901 Long term (current) use of anticoagulants: Secondary | ICD-10-CM | POA: Diagnosis not present

## 2011-11-28 DIAGNOSIS — I10 Essential (primary) hypertension: Secondary | ICD-10-CM | POA: Diagnosis not present

## 2011-11-28 DIAGNOSIS — J189 Pneumonia, unspecified organism: Secondary | ICD-10-CM | POA: Diagnosis not present

## 2011-11-28 DIAGNOSIS — M25559 Pain in unspecified hip: Secondary | ICD-10-CM | POA: Diagnosis not present

## 2011-11-28 DIAGNOSIS — T81718A Complication of other artery following a procedure, not elsewhere classified, initial encounter: Secondary | ICD-10-CM | POA: Diagnosis not present

## 2011-11-28 DIAGNOSIS — J45901 Unspecified asthma with (acute) exacerbation: Secondary | ICD-10-CM | POA: Diagnosis not present

## 2011-12-05 DIAGNOSIS — Z7901 Long term (current) use of anticoagulants: Secondary | ICD-10-CM | POA: Diagnosis not present

## 2012-01-07 ENCOUNTER — Emergency Department (HOSPITAL_BASED_OUTPATIENT_CLINIC_OR_DEPARTMENT_OTHER)
Admission: EM | Admit: 2012-01-07 | Discharge: 2012-01-07 | Disposition: A | Discharge status: Home / Self Care | Payer: Medicare Other | Attending: Emergency Medicine | Admitting: Emergency Medicine

## 2012-01-07 ENCOUNTER — Emergency Department (INDEPENDENT_AMBULATORY_CARE_PROVIDER_SITE_OTHER): Payer: Medicare Other

## 2012-01-07 ENCOUNTER — Encounter (HOSPITAL_BASED_OUTPATIENT_CLINIC_OR_DEPARTMENT_OTHER): Payer: Self-pay | Admitting: *Deleted

## 2012-01-07 DIAGNOSIS — M5146 Schmorl's nodes, lumbar region: Secondary | ICD-10-CM | POA: Diagnosis not present

## 2012-01-07 DIAGNOSIS — Z9089 Acquired absence of other organs: Secondary | ICD-10-CM | POA: Diagnosis not present

## 2012-01-07 DIAGNOSIS — K5900 Constipation, unspecified: Secondary | ICD-10-CM | POA: Insufficient documentation

## 2012-01-07 DIAGNOSIS — K3189 Other diseases of stomach and duodenum: Secondary | ICD-10-CM

## 2012-01-07 DIAGNOSIS — Z86718 Personal history of other venous thrombosis and embolism: Secondary | ICD-10-CM | POA: Diagnosis not present

## 2012-01-07 DIAGNOSIS — M549 Dorsalgia, unspecified: Secondary | ICD-10-CM | POA: Diagnosis not present

## 2012-01-07 DIAGNOSIS — N390 Urinary tract infection, site not specified: Secondary | ICD-10-CM | POA: Diagnosis not present

## 2012-01-07 DIAGNOSIS — R109 Unspecified abdominal pain: Secondary | ICD-10-CM

## 2012-01-07 DIAGNOSIS — Z7901 Long term (current) use of anticoagulants: Secondary | ICD-10-CM | POA: Insufficient documentation

## 2012-01-07 DIAGNOSIS — M519 Unspecified thoracic, thoracolumbar and lumbosacral intervertebral disc disorder: Secondary | ICD-10-CM | POA: Insufficient documentation

## 2012-01-07 HISTORY — DX: Polymyalgia rheumatica: M35.3

## 2012-01-07 HISTORY — DX: Disorder of thyroid, unspecified: E07.9

## 2012-01-07 HISTORY — DX: Lyme disease, unspecified: A69.20

## 2012-01-07 HISTORY — DX: Other pulmonary embolism without acute cor pulmonale: I26.99

## 2012-01-07 HISTORY — DX: Fibromyalgia: M79.7

## 2012-01-07 HISTORY — DX: Complex regional pain syndrome I, unspecified: G90.50

## 2012-01-07 HISTORY — DX: Acute embolism and thrombosis of unspecified vein: I82.90

## 2012-01-07 LAB — PROTIME-INR: Prothrombin Time: 32.3 seconds — ABNORMAL HIGH (ref 11.6–15.2)

## 2012-01-07 LAB — DIFFERENTIAL
Lymphocytes Relative: 13 % (ref 12–46)
Lymphs Abs: 1.3 10*3/uL (ref 0.7–4.0)
Neutrophils Relative %: 83 % — ABNORMAL HIGH (ref 43–77)

## 2012-01-07 LAB — URINE MICROSCOPIC-ADD ON

## 2012-01-07 LAB — COMPREHENSIVE METABOLIC PANEL
AST: 25 U/L (ref 0–37)
CO2: 28 mEq/L (ref 19–32)
Chloride: 100 mEq/L (ref 96–112)
Glucose, Bld: 118 mg/dL — ABNORMAL HIGH (ref 70–99)
Potassium: 4.6 mEq/L (ref 3.5–5.1)
Sodium: 139 mEq/L (ref 135–145)

## 2012-01-07 LAB — URINALYSIS, ROUTINE W REFLEX MICROSCOPIC
Glucose, UA: NEGATIVE mg/dL
Ketones, ur: NEGATIVE mg/dL
Nitrite: NEGATIVE
pH: 7 (ref 5.0–8.0)

## 2012-01-07 LAB — CBC
Hemoglobin: 14.5 g/dL (ref 12.0–15.0)
Platelets: 312 10*3/uL (ref 150–400)
RBC: 4.63 MIL/uL (ref 3.87–5.11)
WBC: 9.9 10*3/uL (ref 4.0–10.5)

## 2012-01-07 LAB — LIPASE, BLOOD: Lipase: 38 U/L (ref 11–59)

## 2012-01-07 MED ORDER — OXYCODONE-ACETAMINOPHEN 5-325 MG PO TABS: 1.0000 | ORAL_TABLET | ORAL | Status: AC | PRN

## 2012-01-07 MED ORDER — ONDANSETRON HCL 4 MG/2ML IJ SOLN
4.0000 mg | Freq: Once | INTRAMUSCULAR | Status: AC
  Administered 2012-01-07: 4 mg via INTRAVENOUS

## 2012-01-07 MED ORDER — HYDROMORPHONE HCL PF 1 MG/ML IJ SOLN
1.0000 mg | Freq: Once | INTRAMUSCULAR | Status: DC
Start: 2012-01-07 — End: 2012-01-07

## 2012-01-07 MED ORDER — HYDROMORPHONE HCL PF 1 MG/ML IJ SOLN
INTRAMUSCULAR | Status: AC
  Administered 2012-01-07: 1 mg via INTRAVENOUS
  Filled 2012-01-07: qty 1

## 2012-01-07 MED ORDER — ONDANSETRON HCL 4 MG/2ML IJ SOLN
INTRAMUSCULAR | Status: AC
  Administered 2012-01-07: 4 mg via INTRAVENOUS
  Filled 2012-01-07: qty 2

## 2012-01-07 MED ORDER — HYDROMORPHONE HCL PF 1 MG/ML IJ SOLN
1.0000 mg | Freq: Once | INTRAMUSCULAR | Status: AC
  Administered 2012-01-07: 1 mg via INTRAVENOUS
  Filled 2012-01-07: qty 1

## 2012-01-07 MED ORDER — ONDANSETRON HCL 4 MG/2ML IJ SOLN
4.0000 mg | Freq: Once | INTRAMUSCULAR | Status: AC
  Administered 2012-01-07: 4 mg via INTRAVENOUS
  Filled 2012-01-07: qty 2

## 2012-01-07 MED ORDER — DEXTROSE 5 % IV SOLN
1.0000 g | Freq: Once | INTRAVENOUS | Status: AC
  Administered 2012-01-07: 1 g via INTRAVENOUS
  Filled 2012-01-07: qty 10

## 2012-01-07 MED ORDER — DOCUSATE SODIUM 100 MG PO CAPS: 100.0000 mg | ORAL_CAPSULE | Freq: Two times a day (BID) | ORAL | Status: AC

## 2012-01-07 MED ORDER — SODIUM CHLORIDE 0.9 % IV BOLUS (SEPSIS)
1000.0000 mL | Freq: Once | INTRAVENOUS | Status: AC
  Administered 2012-01-07: 1000 mL via INTRAVENOUS

## 2012-01-07 MED ORDER — HYDROMORPHONE HCL PF 1 MG/ML IJ SOLN
1.0000 mg | Freq: Once | INTRAMUSCULAR | Status: AC
  Administered 2012-01-07: 1 mg via INTRAVENOUS

## 2012-01-07 MED ORDER — CEPHALEXIN 500 MG PO CAPS: 500.0000 mg | ORAL_CAPSULE | Freq: Three times a day (TID) | ORAL | Status: AC

## 2012-01-07 NOTE — ED Provider Notes (Addendum)
History     CSN: [id number]  Arrival date & time 01/07/12  1311   First MD Initiated Contact with Patient 01/07/12 1405      Chief Complaint  Patient presents with  . Back Pain    (Consider location/radiation/quality/duration/timing/severity/associated sxs/prior treatment) HPI  Patient complaining of lower abdominal pain for two weeks since getting up off couch.  She did not immediately feel pain but began having pain the next morning.  She has been taking percocet and flexeril which she has for other problems.  She has not been seen by her doctor.  She has been using heat and cold therapy.  Nausea, vomited x 3 yesterday, no diarrhea, taking po ok.  No uti symptoms.  PMD is Dr. Windy Carina  Past Medical History  Diagnosis Date  . Pulmonary embolus   . Lyme disease   . Blood clot in vein   . RSD (reflex sympathetic dystrophy)   . Fibromyalgia   . PMR (polymyalgia rheumatica)   . Thyroid disease     Past Surgical History  Procedure Date  . Cholecystectomy     History reviewed. No pertinent family history.  History  Substance Use Topics  . Smoking status: Never Smoker   . Smokeless tobacco: Not on file  . Alcohol Use: No    OB History    Grav Para Term Preterm Abortions TAB SAB Ect Mult Living                  Review of Systems  All other systems reviewed and are negative.    Allergies  Ivp dye; Codeine; and Sulfamethoxazole w/trimethoprim  Home Medications   Current Outpatient Rx  Name Route Sig Dispense Refill  . CITALOPRAM HYDROBROMIDE 20 MG PO TABS Oral Take 30 mg by mouth daily.    Marland Kitchen LEVOTHYROXINE SODIUM 88 MCG PO TABS Oral Take 88 mcg by mouth daily.    Marland Kitchen PREDNISOLONE 5 MG PO TABS Oral Take 5 mg by mouth daily.    Marland Kitchen SIMVASTATIN 10 MG PO TABS Oral Take 10 mg by mouth at bedtime.    Marland Kitchen VERAPAMIL HCL ER (CO) 240 MG PO TB24 Oral Take 80 mg by mouth 3 (three) times daily.    . WARFARIN SODIUM 5 MG PO TABS Oral Take 5 mg by mouth daily.      BP  130/94  Pulse 101  Temp(Src) 97.9 F (36.6 C) (Oral)  Resp 20  Ht 5\' 4"  (1.626 m)  Wt 145 lb (65.772 kg)  BMI 24.89 kg/m2  SpO2 100%  Physical Exam  Nursing note and vitals reviewed. Constitutional: She appears well-developed and well-nourished.  HENT:  Head: Normocephalic and atraumatic.  Eyes: Conjunctivae and EOM are normal. Pupils are equal, round, and reactive to light.  Neck: Normal range of motion. Neck supple.  Cardiovascular: Normal rate, regular rhythm, normal heart sounds and intact distal pulses.   Pulmonary/Chest: Effort normal and breath sounds normal.  Abdominal: Soft. Bowel sounds are normal.       Diffuse lower abdominal tenderness, no rebound  Musculoskeletal: Normal range of motion.  Neurological: She is alert.  Skin: Skin is warm and dry.  Psychiatric: She has a normal mood and affect. Thought content normal.    ED Course  Procedures (including critical care time)  Labs Reviewed - No data to display No results found.   No diagnosis found.   Results for orders placed during the hospital encounter of 01/07/12  CBC  Component Value Range   WBC 9.9  4.0 - 10.5 (K/uL)   RBC 4.63  3.87 - 5.11 (MIL/uL)   Hemoglobin 14.5  12.0 - 15.0 (g/dL)   HCT 16.1  09.6 - 04.5 (%)   MCV 93.3  78.0 - 100.0 (fL)   MCH 31.3  26.0 - 34.0 (pg)   MCHC 33.6  30.0 - 36.0 (g/dL)   RDW 40.9  81.1 - 91.4 (%)   Platelets 312  150 - 400 (K/uL)  DIFFERENTIAL      Component Value Range   Neutrophils Relative 83 (*) 43 - 77 (%)   Neutro Abs 8.2 (*) 1.7 - 7.7 (K/uL)   Lymphocytes Relative 13  12 - 46 (%)   Lymphs Abs 1.3  0.7 - 4.0 (K/uL)   Monocytes Relative 4  3 - 12 (%)   Monocytes Absolute 0.4  0.1 - 1.0 (K/uL)   Eosinophils Relative 0  0 - 5 (%)   Eosinophils Absolute 0.0  0.0 - 0.7 (K/uL)   Basophils Relative 0  0 - 1 (%)   Basophils Absolute 0.0  0.0 - 0.1 (K/uL)  COMPREHENSIVE METABOLIC PANEL      Component Value Range   Sodium 139  135 - 145 (mEq/L)    Potassium 4.6  3.5 - 5.1 (mEq/L)   Chloride 100  96 - 112 (mEq/L)   CO2 28  19 - 32 (mEq/L)   Glucose, Bld 118 (*) 70 - 99 (mg/dL)   BUN 16  6 - 23 (mg/dL)   Creatinine, Ser 7.82  0.50 - 1.10 (mg/dL)   Calcium 9.7  8.4 - 95.6 (mg/dL)   Total Protein 7.4  6.0 - 8.3 (g/dL)   Albumin 4.0  3.5 - 5.2 (g/dL)   AST 25  0 - 37 (U/L)   ALT 34  0 - 35 (U/L)   Alkaline Phosphatase 125 (*) 39 - 117 (U/L)   Total Bilirubin 0.3  0.3 - 1.2 (mg/dL)   GFR calc non Af Amer 74 (*) >90 (mL/min)   GFR calc Af Amer 85 (*) >90 (mL/min)  LIPASE, BLOOD      Component Value Range   Lipase 38  11 - 59 (U/L)  URINALYSIS, ROUTINE W REFLEX MICROSCOPIC      Component Value Range   Color, Urine YELLOW  YELLOW    APPearance CLOUDY (*) CLEAR    Specific Gravity, Urine 1.008  1.005 - 1.030    pH 7.0  5.0 - 8.0    Glucose, UA NEGATIVE  NEGATIVE (mg/dL)   Hgb urine dipstick MODERATE (*) NEGATIVE    Bilirubin Urine NEGATIVE  NEGATIVE    Ketones, ur NEGATIVE  NEGATIVE (mg/dL)   Protein, ur NEGATIVE  NEGATIVE (mg/dL)   Urobilinogen, UA 0.2  0.0 - 1.0 (mg/dL)   Nitrite NEGATIVE  NEGATIVE    Leukocytes, UA LARGE (*) NEGATIVE   PROTIME-INR      Component Value Range   Prothrombin Time 32.3 (*) 11.6 - 15.2 (seconds)   INR 3.08 (*) 0.00 - 1.49   URINE MICROSCOPIC-ADD ON      Component Value Range   Squamous Epithelial / LPF MANY (*) RARE    WBC, UA 3-6  <3 (WBC/hpf)   RBC / HPF 7-10  <3 (RBC/hpf)   Bacteria, UA MANY (*) RARE     MDM    Patient with diffuse lower abdominal pain has been present for 2 weeks. She is on chronic anticoagulations do 2 history of DVTs and  PE. Patient has 36 white blood cells, 710 red blood cells, and many bacteria in her urine. This is to be cultured. I have given her her 1 g of Rocephin IV here. CBC is normal with normal electrolytes although slightly elevated alkaline phosphatase. Patient's care discussed with Dr. Hyman Hopes. The patient is pending CT scan.      Hilario Quarry,  MD 01/07/12 1549  Hilario Quarry, MD 01/08/12 [phone]

## 2012-01-07 NOTE — ED Notes (Signed)
Pt given rx x 3 for keflex, colace and percocet- friend present to drive

## 2012-01-07 NOTE — Discharge Instructions (Signed)
Abdominal Pain (Nonspecific) Your exam might not show the exact reason you have abdominal pain. Since there are many different causes of abdominal pain, another checkup and more tests may be needed. It is very important to follow up for lasting (persistent) or worsening symptoms. A possible cause of abdominal pain in any person who still has his or her appendix is acute appendicitis. Appendicitis is often hard to diagnose. Normal blood tests, urine tests, ultrasound, and CT scans do not completely rule out early appendicitis or other causes of abdominal pain. Sometimes, only the changes that happen over time will allow appendicitis and other causes of abdominal pain to be determined. Other potential problems that may require surgery may also take time to become more apparent. Because of this, it is important that you follow all of the instructions below. HOME CARE INSTRUCTIONS   Rest as much as possible.   Do not eat solid food until your pain is gone.   While adults or children have pain: A diet of water, weak decaffeinated tea, broth or bouillon, gelatin, oral rehydration solutions (ORS), frozen ice pops, or ice chips may be helpful.   When pain is gone in adults or children: Start a light diet (dry toast, crackers, applesauce, or white rice). Increase the diet slowly as long as it does not bother you. Eat no dairy products (including cheese and eggs) and no spicy, fatty, fried, or high-fiber foods.   Use no alcohol, caffeine, or cigarettes.   Take your regular medicines unless your caregiver told you not to.   Take any prescribed medicine as directed.   Only take over-the-counter or prescription medicines for pain, discomfort, or fever as directed by your caregiver. Do not give aspirin to children.  If your caregiver has given you a follow-up appointment, it is very important to keep that appointment. Not keeping the appointment could result in a permanent injury and/or lasting (chronic) pain  and/or disability. If there is any problem keeping the appointment, you must call to reschedule.  SEEK IMMEDIATE MEDICAL CARE IF:   Your pain is not gone in 24 hours.   Your pain becomes worse, changes location, or feels different.   You or your child has an oral temperature above 102 F (38.9 C), not controlled by medicine.   Your baby is older than 3 months with a rectal temperature of 102 F (38.9 C) or higher.   Your baby is 3 months old or younger with a rectal temperature of 100.4 F (38 C) or higher.   You have shaking chills.   You keep throwing up (vomiting) or cannot drink liquids.   There is blood in your vomit or you see blood in your bowel movements.   Your bowel movements become dark or black.   You have frequent bowel movements.   Your bowel movements stop (become blocked) or you cannot pass gas.   You have bloody, frequent, or painful urination.   You have yellow discoloration in the skin or whites of the eyes.   Your stomach becomes bloated or bigger.   You have dizziness or fainting.   You have chest or back pain.  MAKE SURE YOU:   Understand these instructions.   Will watch your condition.   Will get help right away if you are not doing well or get worse.  Document Released: 09/11/2005 Document Revised: 08/31/2011 Document Reviewed: 08/09/2009 Exit[employer] Patient Information 2012 [employer]  RESOURCE GUIDE  Dental Problems  Patients with Medicaid: [facility]                       [facility] [address]                                           [address] Phone:  [phone]                                                   Phone:  [phone]  If unable to pay or uninsured, contact:  [facility] or [facility] to become qualified for the adult dental clinic.  Chronic Pain Problems Contact [facility]  [phone] Patients need to be referred by their primary care  doctor.  Insufficient Money for Medicine Contact [employer]:  call "211" or [facility] 271-5999.  No Primary Care Doctor Call [facility]  832-8000 Other agencies that provide inexpensive medical care    [facility]  [phone]    [facility]  [phone]    [facility]  [phone]    [facility]  [phone]    [facility]  [phone]    [facility]  [phone]  [facility]  [phone] [facility]  [phone] [facility]   [phone] (emergency services 641-4993)  Abuse/Neglect [place] [facility] Abuse Hotline (336) 641-3795 [place] [facility] Hotline 800-378-5315 (After Hours)  Emergency Shelter  [facility] (336) 271-5985  [facility] (336) 275-9566 [facility] (704) 372-4663  MRSA Hotline #:   832-7006    [place] [facility]  [facility] [place] [facility]  [employer]                           [facility] [address] [place]                     [address]         [address]                                                 [place]                              [place] Phone:  [phone]                                  Phone:  [phone]                   Phone:  [phone]  [facility] Phone:  [phone]  [place] Child Abuse Hotline (336) 342-1394 (336) 342-3537 (After Hours)  

## 2012-01-07 NOTE — ED Provider Notes (Signed)
Patient signed out from Dr. Rosalia Hammers. CT A/P without significant cause for pain. +Constipation.  +Schmorl's node but not complaining of significant back pain. Urine sample contaminated but treated in ED with ceftriaxone. Home with keflex. Percocet, colace.  Forbes Cellar, MD 01/07/12 1758

## 2012-01-07 NOTE — ED Notes (Signed)
1 500 ml cup of ready cat  (CT) contrast given at 1440 hrs.  2nd 500 ml cup of contrast will be given at 1540 hrs and patient will be taken to CT approx 1640 hrs for CT scan.  Sherrine Maples

## 2012-01-07 NOTE — ED Notes (Signed)
Pt states she was sitting on a deep couch about 2 weeks ago and got up injuring her back. Has felt nauseated and weak since.

## 2012-01-08 LAB — URINE CULTURE

## 2012-01-10 DIAGNOSIS — N390 Urinary tract infection, site not specified: Secondary | ICD-10-CM | POA: Diagnosis not present

## 2012-01-10 DIAGNOSIS — T81718A Complication of other artery following a procedure, not elsewhere classified, initial encounter: Secondary | ICD-10-CM | POA: Diagnosis not present

## 2012-01-10 DIAGNOSIS — E7800 Pure hypercholesterolemia, unspecified: Secondary | ICD-10-CM | POA: Diagnosis not present

## 2012-01-10 DIAGNOSIS — E039 Hypothyroidism, unspecified: Secondary | ICD-10-CM | POA: Diagnosis not present

## 2012-01-10 DIAGNOSIS — M549 Dorsalgia, unspecified: Secondary | ICD-10-CM | POA: Diagnosis not present

## 2012-01-10 DIAGNOSIS — M353 Polymyalgia rheumatica: Secondary | ICD-10-CM | POA: Diagnosis not present

## 2012-01-10 DIAGNOSIS — Z7901 Long term (current) use of anticoagulants: Secondary | ICD-10-CM | POA: Diagnosis not present

## 2012-01-10 DIAGNOSIS — I10 Essential (primary) hypertension: Secondary | ICD-10-CM | POA: Diagnosis not present

## 2012-01-12 DIAGNOSIS — M549 Dorsalgia, unspecified: Secondary | ICD-10-CM | POA: Diagnosis not present

## 2012-01-12 DIAGNOSIS — Z7901 Long term (current) use of anticoagulants: Secondary | ICD-10-CM | POA: Diagnosis not present

## 2012-01-19 DIAGNOSIS — Z7901 Long term (current) use of anticoagulants: Secondary | ICD-10-CM | POA: Diagnosis not present

## 2012-01-22 DIAGNOSIS — K5900 Constipation, unspecified: Secondary | ICD-10-CM | POA: Diagnosis not present

## 2012-01-22 DIAGNOSIS — R109 Unspecified abdominal pain: Secondary | ICD-10-CM | POA: Diagnosis not present

## 2012-01-22 DIAGNOSIS — F411 Generalized anxiety disorder: Secondary | ICD-10-CM | POA: Diagnosis not present

## 2012-02-02 DIAGNOSIS — I10 Essential (primary) hypertension: Secondary | ICD-10-CM | POA: Diagnosis not present

## 2012-02-02 DIAGNOSIS — I2699 Other pulmonary embolism without acute cor pulmonale: Secondary | ICD-10-CM | POA: Diagnosis not present

## 2012-02-02 DIAGNOSIS — M545 Low back pain: Secondary | ICD-10-CM | POA: Diagnosis not present

## 2012-02-02 DIAGNOSIS — T81718A Complication of other artery following a procedure, not elsewhere classified, initial encounter: Secondary | ICD-10-CM | POA: Diagnosis not present

## 2012-02-02 DIAGNOSIS — Z7901 Long term (current) use of anticoagulants: Secondary | ICD-10-CM | POA: Diagnosis not present

## 2012-02-02 DIAGNOSIS — E7800 Pure hypercholesterolemia, unspecified: Secondary | ICD-10-CM | POA: Diagnosis not present

## 2012-02-02 DIAGNOSIS — F411 Generalized anxiety disorder: Secondary | ICD-10-CM | POA: Diagnosis not present

## 2012-02-16 DIAGNOSIS — J45901 Unspecified asthma with (acute) exacerbation: Secondary | ICD-10-CM | POA: Diagnosis not present

## 2012-02-23 DIAGNOSIS — Z7901 Long term (current) use of anticoagulants: Secondary | ICD-10-CM | POA: Diagnosis not present

## 2012-02-23 DIAGNOSIS — T81718A Complication of other artery following a procedure, not elsewhere classified, initial encounter: Secondary | ICD-10-CM | POA: Diagnosis not present

## 2012-02-23 DIAGNOSIS — M353 Polymyalgia rheumatica: Secondary | ICD-10-CM | POA: Diagnosis not present

## 2012-02-23 DIAGNOSIS — I2699 Other pulmonary embolism without acute cor pulmonale: Secondary | ICD-10-CM | POA: Diagnosis not present

## 2012-02-23 DIAGNOSIS — F411 Generalized anxiety disorder: Secondary | ICD-10-CM | POA: Diagnosis not present

## 2012-02-23 DIAGNOSIS — M25559 Pain in unspecified hip: Secondary | ICD-10-CM | POA: Diagnosis not present

## 2012-02-23 DIAGNOSIS — J45901 Unspecified asthma with (acute) exacerbation: Secondary | ICD-10-CM | POA: Diagnosis not present

## 2012-03-04 DIAGNOSIS — M545 Low back pain: Secondary | ICD-10-CM | POA: Diagnosis not present

## 2012-03-05 DIAGNOSIS — Z7901 Long term (current) use of anticoagulants: Secondary | ICD-10-CM | POA: Diagnosis not present

## 2012-03-08 DIAGNOSIS — M5137 Other intervertebral disc degeneration, lumbosacral region: Secondary | ICD-10-CM | POA: Diagnosis not present

## 2012-03-11 DIAGNOSIS — M545 Low back pain: Secondary | ICD-10-CM | POA: Diagnosis not present

## 2012-03-12 ENCOUNTER — Other Ambulatory Visit (HOSPITAL_COMMUNITY): Payer: Self-pay | Admitting: Orthopedic Surgery

## 2012-03-12 DIAGNOSIS — M549 Dorsalgia, unspecified: Secondary | ICD-10-CM

## 2012-03-19 ENCOUNTER — Encounter (HOSPITAL_COMMUNITY)
Admission: RE | Admit: 2012-03-19 | Discharge: 2012-03-19 | Disposition: A | Discharge status: Home / Self Care | Payer: Medicare Other | Source: Ambulatory Visit | Attending: Orthopedic Surgery | Admitting: Orthopedic Surgery

## 2012-03-19 DIAGNOSIS — M8448XA Pathological fracture, other site, initial encounter for fracture: Secondary | ICD-10-CM | POA: Diagnosis not present

## 2012-03-19 DIAGNOSIS — R948 Abnormal results of function studies of other organs and systems: Secondary | ICD-10-CM | POA: Diagnosis not present

## 2012-03-19 DIAGNOSIS — M439 Deforming dorsopathy, unspecified: Secondary | ICD-10-CM | POA: Diagnosis not present

## 2012-03-19 DIAGNOSIS — S32009A Unspecified fracture of unspecified lumbar vertebra, initial encounter for closed fracture: Secondary | ICD-10-CM | POA: Diagnosis not present

## 2012-03-19 DIAGNOSIS — M545 Low back pain: Secondary | ICD-10-CM | POA: Diagnosis not present

## 2012-03-19 DIAGNOSIS — M549 Dorsalgia, unspecified: Secondary | ICD-10-CM

## 2012-03-19 DIAGNOSIS — X58XXXA Exposure to other specified factors, initial encounter: Secondary | ICD-10-CM | POA: Insufficient documentation

## 2012-03-19 MED ORDER — TECHNETIUM TC 99M MEDRONATE IV KIT
25.0000 | PACK | Freq: Once | INTRAVENOUS | Status: AC | PRN
  Administered 2012-03-19: 25 via INTRAVENOUS

## 2012-03-22 DIAGNOSIS — M25559 Pain in unspecified hip: Secondary | ICD-10-CM | POA: Diagnosis not present

## 2012-03-22 DIAGNOSIS — F329 Major depressive disorder, single episode, unspecified: Secondary | ICD-10-CM | POA: Diagnosis not present

## 2012-03-22 DIAGNOSIS — M2550 Pain in unspecified joint: Secondary | ICD-10-CM | POA: Diagnosis not present

## 2012-03-22 DIAGNOSIS — M353 Polymyalgia rheumatica: Secondary | ICD-10-CM | POA: Diagnosis not present

## 2012-03-22 DIAGNOSIS — T81718A Complication of other artery following a procedure, not elsewhere classified, initial encounter: Secondary | ICD-10-CM | POA: Diagnosis not present

## 2012-03-22 DIAGNOSIS — J45901 Unspecified asthma with (acute) exacerbation: Secondary | ICD-10-CM | POA: Diagnosis not present

## 2012-03-22 DIAGNOSIS — M810 Age-related osteoporosis without current pathological fracture: Secondary | ICD-10-CM | POA: Diagnosis not present

## 2012-03-22 DIAGNOSIS — I2699 Other pulmonary embolism without acute cor pulmonale: Secondary | ICD-10-CM | POA: Diagnosis not present

## 2012-04-03 IMAGING — CR DG CHEST 1V PORT
1 series · 1 of 1 positions shown · non-contrast
Comparison: None

CLINICAL DATA: Fever and shortness of breath

PORTABLE CHEST - 1 VIEW

[series 1]
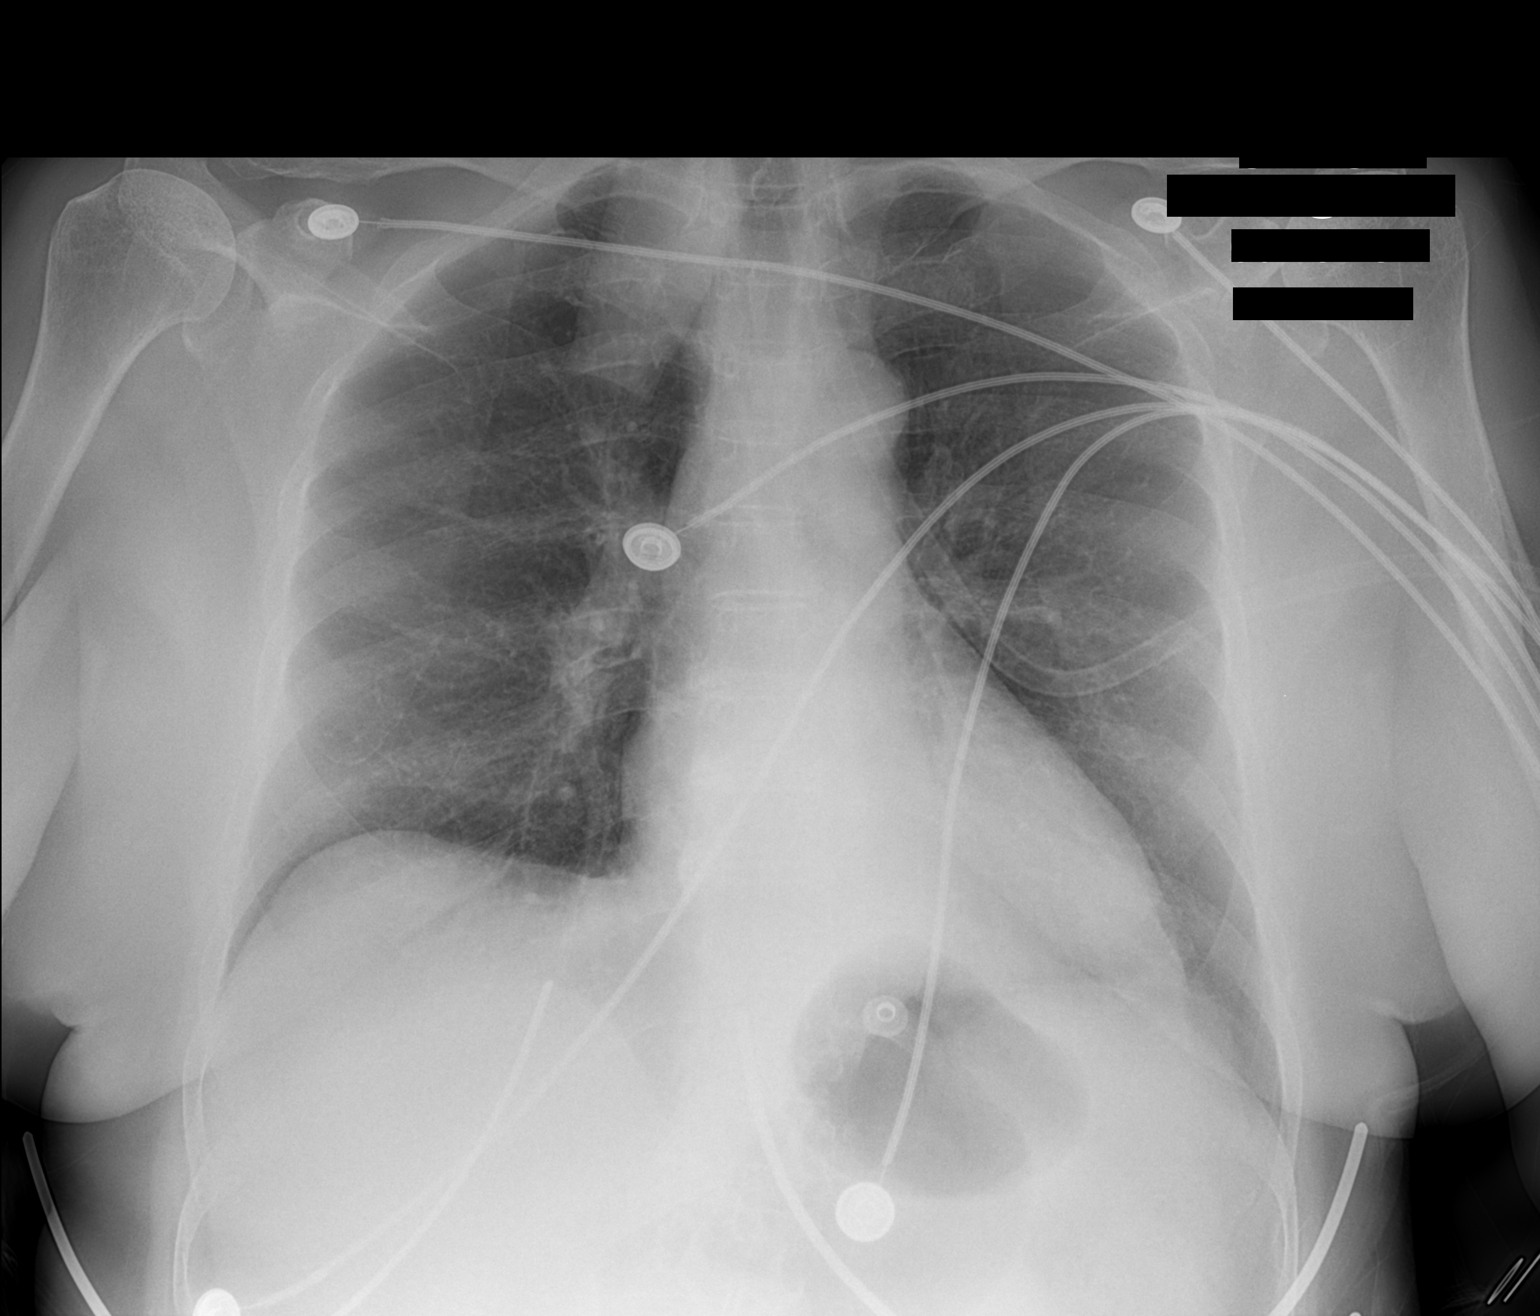

[1 of 1 positions shown; findings below may reference images not displayed]

FINDINGS: The cardiomediastinal silhouette is unremarkable.
The lungs are clear although evaluation of the left retrocardiac
region is difficult on this study.
There is no evidence of focal airspace disease, pulmonary edema,
pleural effusion, or pneumothorax.
No acute bony abnormalities are identified.
IMPRESSION: No definite acute cardiopulmonary disease.  The left retrocardiac
region is difficult to evaluate on this study and consider lateral
chest for further evaluation.

## 2012-04-12 DIAGNOSIS — I2699 Other pulmonary embolism without acute cor pulmonale: Secondary | ICD-10-CM | POA: Diagnosis not present

## 2012-04-12 DIAGNOSIS — Z7901 Long term (current) use of anticoagulants: Secondary | ICD-10-CM | POA: Diagnosis not present

## 2012-04-12 DIAGNOSIS — M8440XA Pathological fracture, unspecified site, initial encounter for fracture: Secondary | ICD-10-CM | POA: Diagnosis not present

## 2012-04-12 DIAGNOSIS — R079 Chest pain, unspecified: Secondary | ICD-10-CM | POA: Diagnosis not present

## 2012-04-12 DIAGNOSIS — G894 Chronic pain syndrome: Secondary | ICD-10-CM | POA: Diagnosis not present

## 2012-04-12 DIAGNOSIS — T81718A Complication of other artery following a procedure, not elsewhere classified, initial encounter: Secondary | ICD-10-CM | POA: Diagnosis not present

## 2012-04-19 ENCOUNTER — Ambulatory Visit (HOSPITAL_BASED_OUTPATIENT_CLINIC_OR_DEPARTMENT_OTHER): Payer: Medicare Other | Admitting: Physical Medicine & Rehabilitation

## 2012-04-19 ENCOUNTER — Encounter: Payer: Self-pay | Admitting: Physical Medicine & Rehabilitation

## 2012-04-19 ENCOUNTER — Telehealth: Payer: Self-pay | Admitting: Physical Medicine & Rehabilitation

## 2012-04-19 ENCOUNTER — Encounter: Payer: Medicare Other | Attending: Physical Medicine & Rehabilitation

## 2012-04-19 VITALS — BP 142/80 | HR 84 | Resp 16 | Ht 65.0 in | Wt 155.0 lb

## 2012-04-19 DIAGNOSIS — M47817 Spondylosis without myelopathy or radiculopathy, lumbosacral region: Secondary | ICD-10-CM | POA: Diagnosis not present

## 2012-04-19 DIAGNOSIS — Z86718 Personal history of other venous thrombosis and embolism: Secondary | ICD-10-CM | POA: Insufficient documentation

## 2012-04-19 DIAGNOSIS — M545 Low back pain, unspecified: Secondary | ICD-10-CM | POA: Diagnosis not present

## 2012-04-19 DIAGNOSIS — G894 Chronic pain syndrome: Secondary | ICD-10-CM | POA: Insufficient documentation

## 2012-04-19 DIAGNOSIS — Z7901 Long term (current) use of anticoagulants: Secondary | ICD-10-CM | POA: Diagnosis not present

## 2012-04-19 DIAGNOSIS — Z86711 Personal history of pulmonary embolism: Secondary | ICD-10-CM | POA: Diagnosis not present

## 2012-04-19 DIAGNOSIS — G8929 Other chronic pain: Secondary | ICD-10-CM | POA: Diagnosis not present

## 2012-04-19 NOTE — Telephone Encounter (Signed)
Patient will be having an injection in August and is on Coumadin. Please call patient with information on when she needs to get off Coumadin.

## 2012-04-19 NOTE — Patient Instructions (Signed)
You will have lumbar medial branch blocks next visit , Your Coumadin 5 days prior to the injection and get a pro time checked within 24 hours of the injection We will do a toward all injection today into your right hip this is a general anti-inflammatory. You can continue your pain medicine until I see you next.

## 2012-04-19 NOTE — Telephone Encounter (Signed)
Stop Coumadin 5 days prior to procedure. Will need pro time within 24 hours her procedure. I will need to get these results

## 2012-04-19 NOTE — Telephone Encounter (Signed)
Please advise on how pt should take coumadin before procedure.

## 2012-04-19 NOTE — Progress Notes (Signed)
Subjective:    Patient ID: Elizabeth [name], female    DOB: June 22, 1941, 71 y.o.   MRN: [id number]  HPI 71 year old female with a long history of chronic pain. She has been on narcotic analgesics since 1990. 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 Chief complaint is low back and buttock pain. She was diagnosed with lumbar compression fractures at L1, L3, L4 in February or March of 2013. She has seen multiple physicians including 2 orthopedists 1 physical medicine and rehabilitation physician as well as her primary care physician. Her pain is rated as 8/10 interferes with activity at 7-8/10 level. Her past medical history significant for hypertension as well as pulmonary embolism Chronic Coumadin, IVC filter. Has come off of Coumadin for lumbar procedures. Has been treated in a pain clinic in [place]. No numbness or tingling in the legs. No bowel or bladder dysfunction MRI reports reviewed. There is evidence of multilevel lumbar facet arthropathy L3-4, L4-5, L5-S1. No evidence of nerve root compression in the lumbar spine. X-rays demonstrate a severe kyphosis centered in the upper lumbar area. Left hip x-ray shows a chronic calcific tendinitis but no evidence of hip joint problems No significant improvement after trochanteric bursa injections performed by orthopedics. Also had some type of lumbar injection performed by physical medicine rehabilitation. I do not have the records. It was a single injection. Judging by the physician notes it appears to have been an epidural injection at L2-L3 Pain Inventory Average Pain 8 Pain Right Now 8 My pain is sharp  In the last 24 hours, has pain interfered with the following? General activity 8 Relation with others 7 Enjoyment of life 7 What TIME of day is your pain at its worst? Morning and Daytime Sleep (in general) Good  Pain is worse with: walking, bending, sitting, standing and    Pain improves with: rest, heat/ice, therapy/exercise and medication Relief from Meds: 6  Mobility walk without assistance ability to climb steps?  yes do you drive?  yes  Function disabled: date disabled 33  Neuro/Psych tremor depression anxiety  Prior Studies x-rays CT/MRI  Physicians involved in your care Primary care Dr. Clyde Canterbury   No family history on file. History   Social History  . Marital Status: Divorced    Spouse Name: N/A    Number of Children: N/A  . Years of Education: N/A   Social History Main Topics  . Smoking status: Never Smoker   . Smokeless tobacco: None  . Alcohol Use: No  . Drug Use: No  . Sexually Active:    Other Topics Concern  . None   Social History Narrative  . None   Past Surgical History  Procedure Date  . Cholecystectomy    Past Medical History  Diagnosis Date  . Pulmonary embolus   . Lyme disease   . Blood clot in vein   . RSD (reflex sympathetic dystrophy)   . Fibromyalgia   . PMR (polymyalgia rheumatica)   . Thyroid disease    BP 142/80  Pulse 84  Resp 16  Ht 5\' 5"  (1.651 m)  Wt 155 lb (70.308 kg)  BMI 25.79 kg/m2  SpO2 98%      Review of Systems  HENT: Negative.   Eyes: Negative.   Respiratory: Negative.   Cardiovascular: Negative.   Gastrointestinal: Positive for abdominal pain.  Genitourinary: Negative.   Musculoskeletal: Positive for myalgias, back pain and arthralgias.  Skin: Negative.   Neurological: Positive for  tremors.  Hematological: Negative.   Psychiatric/Behavioral: Negative.        Objective:   Physical Exam  Constitutional: She is oriented to person, place, and time. She appears well-developed and well-nourished.  HENT:  Head: Normocephalic and atraumatic.  Right Ear: External ear normal.  Left Ear: External ear normal.  Eyes: Conjunctivae and EOM are normal. Pupils are equal, round, and reactive to light.  Neck: Normal range of motion.  Musculoskeletal:       Right  hip: Normal.       Left hip: She exhibits tenderness.       Lumbar back: She exhibits decreased range of motion, tenderness and pain. She exhibits no bony tenderness, no swelling, no edema, no deformity, no laceration and no spasm.       Left hip tenderness over the greater tuberosity. No tenderness over the ischial tuberosity Faber's test negative on the right.  groin pain on the left  Neurological: She is alert and oriented to person, place, and time. She has normal strength. No cranial nerve deficit or sensory deficit. Gait normal.  Reflex Scores:      Tricep reflexes are 2+ on the right side and 2+ on the left side.      Bicep reflexes are 2+ on the right side and 2+ on the left side.      Brachioradialis reflexes are 2+ on the right side and 2+ on the left side.      Patellar reflexes are 2+ on the right side and 2+ on the left side.      Achilles reflexes are 2+ on the right side and 2+ on the left side. Psychiatric: Her mood appears anxious. Her affect is inappropriate. Her speech is rapid and/or pressured. She exhibits a depressed mood.          Assessment & Plan:  1. Lumbar pain she has significant spondylosis on imaging studies and has pain with extension greater than with flexion on examination. We'll set her up for lumbar medial branch blocks.  2. Chronic pain syndrome may benefit from pain psychology. She like to hold off on this currently but we'll discuss this again in the future 3.possible Left chronic reflex sympathetic dystrophy without any vasomotor signs.  4. History of DVT and pulmonary embolism now has IVC filter. Would be safe to taper off Coumadin for short per time to do elective procedure.no history of atrial fibrillation

## 2012-04-19 NOTE — Progress Notes (Deleted)
  Subjective:    Patient ID: Elizabeth [name], female    DOB: [birth year], 71 y.o.   MRN: [id number]  HPI    Review of Systems     Objective:   Physical Exam        Assessment & Plan:

## 2012-04-22 NOTE — Telephone Encounter (Signed)
Patient called back this morning. Gave her the message about stopping Coumadin 5 days prior to procedure and having protime done.

## 2012-04-22 NOTE — Telephone Encounter (Signed)
Lm for pt to call office regarding coumadin before procedure.

## 2012-05-17 DIAGNOSIS — T81718A Complication of other artery following a procedure, not elsewhere classified, initial encounter: Secondary | ICD-10-CM | POA: Diagnosis not present

## 2012-05-17 DIAGNOSIS — Z7901 Long term (current) use of anticoagulants: Secondary | ICD-10-CM | POA: Diagnosis not present

## 2012-05-17 DIAGNOSIS — M545 Low back pain: Secondary | ICD-10-CM | POA: Diagnosis not present

## 2012-05-17 DIAGNOSIS — R109 Unspecified abdominal pain: Secondary | ICD-10-CM | POA: Diagnosis not present

## 2012-05-28 ENCOUNTER — Ambulatory Visit: Payer: Medicare Other | Admitting: Physical Medicine & Rehabilitation

## 2012-05-30 ENCOUNTER — Other Ambulatory Visit: Payer: Self-pay | Admitting: Physical Medicine and Rehabilitation

## 2012-05-30 ENCOUNTER — Ambulatory Visit (HOSPITAL_BASED_OUTPATIENT_CLINIC_OR_DEPARTMENT_OTHER): Payer: Medicare Other | Admitting: Physical Medicine & Rehabilitation

## 2012-05-30 ENCOUNTER — Encounter: Payer: Self-pay | Admitting: Physical Medicine & Rehabilitation

## 2012-05-30 ENCOUNTER — Encounter: Payer: Medicare Other | Attending: Physical Medicine & Rehabilitation

## 2012-05-30 VITALS — BP 166/81 | HR 75 | Resp 16 | Ht 65.0 in | Wt 153.0 lb

## 2012-05-30 DIAGNOSIS — Z7901 Long term (current) use of anticoagulants: Secondary | ICD-10-CM | POA: Insufficient documentation

## 2012-05-30 DIAGNOSIS — G894 Chronic pain syndrome: Secondary | ICD-10-CM | POA: Diagnosis not present

## 2012-05-30 DIAGNOSIS — Z86711 Personal history of pulmonary embolism: Secondary | ICD-10-CM | POA: Diagnosis not present

## 2012-05-30 DIAGNOSIS — M545 Low back pain, unspecified: Secondary | ICD-10-CM | POA: Diagnosis not present

## 2012-05-30 DIAGNOSIS — G8929 Other chronic pain: Secondary | ICD-10-CM | POA: Diagnosis not present

## 2012-05-30 DIAGNOSIS — Z86718 Personal history of other venous thrombosis and embolism: Secondary | ICD-10-CM | POA: Insufficient documentation

## 2012-05-30 DIAGNOSIS — M47817 Spondylosis without myelopathy or radiculopathy, lumbosacral region: Secondary | ICD-10-CM

## 2012-05-30 MED ORDER — TRAMADOL HCL 50 MG PO TABS: 50.0000 mg | ORAL_TABLET | Freq: Four times a day (QID) | ORAL | Status: AC | PRN

## 2012-05-30 NOTE — Patient Instructions (Addendum)
If we are to assume the prescription of your oxycodone, we need to get the results of your urine drug screen. This will take about a week to come back. In the meanwhile I will start  tramadol and you can try this every 6 hours as needed You can get additional oxycodone from the doctor who prescribed it for you last  Next visit is for another injection you will have to come off your Coumadin at least 5 days prior to the injection get INR testing within 24 hours of the injection. If your pain is very well-controlled from this injection and you don't feel like he need another injection please call our office

## 2012-05-30 NOTE — Progress Notes (Signed)
Bilateral Lumbar L2,L3, L4  medial branch blocks and L 5 dorsal ramus injection under fluoroscopic guidance  Indication: Lumbar pain which is not relieved by medication management or other conservative care and interfering with self-care and mobility.  Informed consent was obtained after describing risks and benefits of the procedure with the patient, this includes bleeding, infection, paralysis and medication side effects.  The patient wishes to proceed and has given written consent.  The patient was placed in prone position.  The lumbar area was marked and prepped with Betadine.  One mL of 1% lidocaine was injected into each of 8 areas into the skin and subcutaneous tissue.  Then a 22-gauge 3.5in spinal needle was inserted targeting the junction of the left S1 superior articular process and sacral ala junction. Needle was advanced under fluoroscopic guidance.  Bone contact was made.  Omnipaque 180 was injected x 0.5 mL demonstrating no intravascular uptake.  Then a solution containing one mL of 4 mg per mL dexamethasone and 3 mL of 2% MPF lidocaine was injected x 0.5 mL.  Then the left L5 superior articular process in transverse process junction was targeted.  Bone contact was made.  Omnipaque 180 was injected x 0.5 mL demonstrating no intravascular uptake. Then a solution containing one mL of 4 mg per mL dexamethasone and 3 mL of 2% MPF lidocaine was injected x 0.5 mL.  Then the left L4 superior articular process in transverse process junction was targeted.  Bone contact was made.  Omnipaque 180 was injected x 0.5 mL demonstrating no intravascular uptake.  Then the left L3 superior articular process in transverse process junction was targeted.  Bone contact was made.  Omnipaque 180 was injected x 0.5 mL demonstrating no intravascular uptake Then a solution containing one mL of 4 mg per mL dexamethasone and 3 mL if 2% MPF lidocaine was injected x 0.5 mL.  This same procedure was performed on the right side  using the same needle, technique and injectate.  Patient tolerated procedure well.  Post procedure instructions were given.  INR prior to procedure 1.0

## 2012-05-30 NOTE — Addendum Note (Signed)
Addended by: Caryl Ada on: 05/30/2012 01:12 PM   Modules accepted: Orders

## 2012-05-30 NOTE — Progress Notes (Signed)
  PROCEDURE RECORD [facility]   Name: Elizabeth [name] DOB:1940/11/16 MRN: [id number]  Date:05/30/2012  Physician: Claudette Laws, MD    Nurse/CMA: Redgie Grayer  Allergies:  Allergies  Allergen Reactions  . Ivp Dye (Iodinated Diagnostic Agents) Anaphylaxis  . Codeine   . Sulfamethoxazole W-Trimethoprim     Consent Signed: yes  Is patient diabetic? no    Pregnant: no LMP: No LMP recorded. Patient is postmenopausal. (age 33-55)  Anticoagulants: no Anti-inflammatory: no Antibiotics: no  Procedure: Medial Branch Block  Position: Prone Start Time: 10:19am  End Time: 10:32am  Fluoro Time: 35 seconds  RN/CMA Carroll,CMA Carroll,CMA    Time 10:00am 10:35am    BP 166/81 126/73    Pulse 75 81    Respirations 16 16    O2 Sat 98% 91%    S/S 6 6    Pain Level 6/10 4/10     D/C home with Consuella Lose (friend), patient A & O X 3, D/C instructions reviewed, and sits independently.

## 2012-06-04 ENCOUNTER — Telehealth: Payer: Self-pay | Admitting: *Deleted

## 2012-06-04 NOTE — Telephone Encounter (Signed)
Wants a call back about why pt is calling their office for her pain meds.  I called and left a message with French Ana letting her know that we just recently obtained an UDS on the pt and if they could give her a 2 week supply of her meds until we get the results back we would appreciate it.

## 2012-06-05 ENCOUNTER — Telehealth: Payer: Self-pay | Admitting: *Deleted

## 2012-06-05 DIAGNOSIS — Z7901 Long term (current) use of anticoagulants: Secondary | ICD-10-CM | POA: Diagnosis not present

## 2012-06-05 DIAGNOSIS — M2550 Pain in unspecified joint: Secondary | ICD-10-CM | POA: Diagnosis not present

## 2012-06-05 DIAGNOSIS — I10 Essential (primary) hypertension: Secondary | ICD-10-CM | POA: Diagnosis not present

## 2012-06-05 DIAGNOSIS — T81718A Complication of other artery following a procedure, not elsewhere classified, initial encounter: Secondary | ICD-10-CM | POA: Diagnosis not present

## 2012-06-05 DIAGNOSIS — I2699 Other pulmonary embolism without acute cor pulmonale: Secondary | ICD-10-CM | POA: Diagnosis not present

## 2012-06-05 NOTE — Telephone Encounter (Signed)
Inconsistent UDS. Positive for ETOH and Marijuana. We will continue to treat her but it will be non-narcotic treatment.  LM for pt to call back

## 2012-06-06 NOTE — Telephone Encounter (Signed)
Left another message for the pt to call back.  If we continue to leave her messages and we don't reach her, she will be notified of her non-narcotic status at her next appointment.

## 2012-06-06 NOTE — Telephone Encounter (Signed)
Pt is aware of non-narcotic treatment. She states that she did smoke marijuana a few weeks ago for her birthday.

## 2012-06-13 DIAGNOSIS — Z7901 Long term (current) use of anticoagulants: Secondary | ICD-10-CM | POA: Diagnosis not present

## 2012-06-27 ENCOUNTER — Encounter: Payer: Self-pay | Admitting: Physical Medicine & Rehabilitation

## 2012-06-27 ENCOUNTER — Ambulatory Visit (HOSPITAL_BASED_OUTPATIENT_CLINIC_OR_DEPARTMENT_OTHER): Payer: Medicare Other | Admitting: Physical Medicine & Rehabilitation

## 2012-06-27 ENCOUNTER — Encounter: Payer: Medicare Other | Attending: Physical Medicine & Rehabilitation

## 2012-06-27 VITALS — BP 131/85 | HR 80 | Resp 14 | Ht 64.0 in | Wt 153.2 lb

## 2012-06-27 DIAGNOSIS — M545 Low back pain, unspecified: Secondary | ICD-10-CM | POA: Insufficient documentation

## 2012-06-27 DIAGNOSIS — M533 Sacrococcygeal disorders, not elsewhere classified: Secondary | ICD-10-CM | POA: Diagnosis not present

## 2012-06-27 DIAGNOSIS — Z86718 Personal history of other venous thrombosis and embolism: Secondary | ICD-10-CM | POA: Insufficient documentation

## 2012-06-27 DIAGNOSIS — Z86711 Personal history of pulmonary embolism: Secondary | ICD-10-CM | POA: Insufficient documentation

## 2012-06-27 DIAGNOSIS — G8929 Other chronic pain: Secondary | ICD-10-CM | POA: Insufficient documentation

## 2012-06-27 DIAGNOSIS — Z7901 Long term (current) use of anticoagulants: Secondary | ICD-10-CM | POA: Diagnosis not present

## 2012-06-27 DIAGNOSIS — G894 Chronic pain syndrome: Secondary | ICD-10-CM | POA: Diagnosis not present

## 2012-06-27 DIAGNOSIS — M47817 Spondylosis without myelopathy or radiculopathy, lumbosacral region: Secondary | ICD-10-CM | POA: Insufficient documentation

## 2012-06-27 NOTE — Progress Notes (Signed)
  PROCEDURE RECORD [facility]   Name: Elizabeth [name] DOB:[birth year] MRN: [id number]  Date:06/27/2012  Physician: Claudette Laws, MD    Nurse/CMA: Pahola Dimmitt RN  Allergies:  Allergies  Allergen Reactions  . Ivp Dye (Iodinated Diagnostic Agents) Anaphylaxis  . Codeine   . Sulfamethoxazole W-Trimethoprim     Consent Signed: yes  Is patient diabetic? no  CBG today?   Pregnant: no LMP: No LMP recorded. Patient is postmenopausal. (age 20-55)  Anticoagulants: yes (coumadin, has bee off for 5 days. INR1.0 done this AM) Anti-inflammatory: no Antibiotics: no  Procedure: Sacroiliac Steroid Injection  Right side Position: Prone Start Time: 10:26 End Time: 10:29 Fluoro Time: 13seconds  RN/CMA Ligia Duguay Chukwudi Ewen    Time 9:42 10:35    BP 131/85 141/61    Pulse 80 78    Respirations 14   14    O2 Sat 96 95    S/S 6 6    Pain Level 6/10   0/     D/C home with Consuella Lose, patient A & O X 3, D/C instructions reviewed, and sits independently.

## 2012-06-27 NOTE — Progress Notes (Signed)
  Subjective:    Patient ID: Mataya Kilduff, female    DOB: January 07, 1941, 71 y.o.   MRN: [id number]  HPI    Review of Systems     Objective:   Physical Exam        Assessment & Plan:  Right sacroiliac injection under fluoroscopic guidance  Indication: Right Low back and buttocks pain not relieved by medication management and other conservative care.  Informed consent was obtained after describing risks and benefits of the procedure with the patient, this includes bleeding, bruising, infection, paralysis and medication side effects. The patient wishes to proceed and has given written consent. The patient was placed in a prone position. The lumbar and sacral area was marked and prepped with Betadine. A 25-gauge 1-1/2 inch needle was inserted into the skin and subcutaneous tissue and 1 mL of 1% lidocaine was injected. Then a 25-gauge 3 inch spinal needle was inserted under fluoroscopic guidance into the Right sacroiliac joint. AP and lateral images were utilized. Omnipaque 180x0.5 mL under live fluoroscopy demonstrated no intravascular uptake. Then a solution containing one ML of 40 mg per mL depomedrol and 2 ML of 1% lidocaine MPF was injected x1.5 mL. Patient tolerated the procedure well. Post procedure instructions were given. Please see post procedure form.

## 2012-06-27 NOTE — Patient Instructions (Addendum)
Sacroiliac Joint Dysfunction The sacroiliac joint connects the lower part of the spine (the sacrum) with the bones of the pelvis. CAUSES  Sometimes, there is no obvious reason for sacroiliac joint dysfunction. Other times, it may occur   During pregnancy.  After injury, such as:  Car accidents.  Sport-related injuries.  Work-related injuries.  Due to one leg being shorter than the other.  Due to other conditions that affect the joints, such as:  Rheumatoid arthritis.  Gout.  Psoriasis.  Joint infection (septic arthritis). SYMPTOMS  Symptoms may include:  Pain in the:  Lower back.  Buttocks.  Groin.  Thighs and legs.  Difficult sitting, standing, walking, lying, bending or lifting. DIAGNOSIS  A number of tests may be used to help diagnose the cause of sacroiliac joint dysfunction, including:  Imaging tests to look for other causes of pain, including:  MRI.  CT scan.  Bone scan.  Diagnostic injection: During a special x-ray (called fluoroscopy), a needle is put into the sacroiliac joint. A numbing medicine is injected into the joint. If the pain is improved or stopped, the diagnosis of sacroiliac joint dysfunction is more likely. TREATMENT  There are a number of types of treatment used for sacroiliac joint dysfunction, including:  Only take over-the-counter or prescription medicines for pain, discomfort, or fever as directed by your caregiver.  Medications to relax muscles.  Rest. Decreasing activity can help cut down on painful muscle spasms and allow the back to heal.  Application of heat or ice to the lower back may improve muscle spasms and soothe pain.  Brace. A special back brace, called a sacroiliac belt, can help support the joint while your back is healing.  Physical therapy can help teach comfortable positions and exercises to strengthen muscles that support the sacroiliac joint.  Cortisone injections. Injections of steroid medicine into the  joint can help decrease swelling and improve pain.  Hyaluronic acid injections. This chemical improves lubrication within the sacroiliac joint, thereby decreasing pain.  Radiofrequency ablation. A special needle is placed into the joint, where it burns away nerves that are carrying pain messages from the joint.  Surgery. Because pain occurs during movement of the joint, screws and plates may be installed in order to limit or prevent joint motion. HOME CARE INSTRUCTIONS   Take all medications exactly as directed.  Follow instructions regarding both rest and physical activity, to avoid worsening the pain.  Do physical therapy exercises exactly as prescribed. SEEK IMMEDIATE MEDICAL CARE IF:  You experience increasingly severe pain.  You develop new symptoms, such as numbness or tingling in your legs or feet.  You lose bladder or bowel control. Document Released: 12/08/2008 Document Revised: 12/04/2011 Document Reviewed: 12/08/2008 [facility] Patient Information 2013 [place], [place].  Resume Coumadin today

## 2012-07-10 DIAGNOSIS — F329 Major depressive disorder, single episode, unspecified: Secondary | ICD-10-CM | POA: Diagnosis not present

## 2012-07-10 DIAGNOSIS — E7800 Pure hypercholesterolemia, unspecified: Secondary | ICD-10-CM | POA: Diagnosis not present

## 2012-07-10 DIAGNOSIS — T81718A Complication of other artery following a procedure, not elsewhere classified, initial encounter: Secondary | ICD-10-CM | POA: Diagnosis not present

## 2012-07-10 DIAGNOSIS — Z23 Encounter for immunization: Secondary | ICD-10-CM | POA: Diagnosis not present

## 2012-07-10 DIAGNOSIS — M2550 Pain in unspecified joint: Secondary | ICD-10-CM | POA: Diagnosis not present

## 2012-07-10 DIAGNOSIS — Z7901 Long term (current) use of anticoagulants: Secondary | ICD-10-CM | POA: Diagnosis not present

## 2012-07-10 DIAGNOSIS — I2699 Other pulmonary embolism without acute cor pulmonale: Secondary | ICD-10-CM | POA: Diagnosis not present

## 2012-07-10 DIAGNOSIS — Z79899 Other long term (current) drug therapy: Secondary | ICD-10-CM | POA: Diagnosis not present

## 2012-07-10 DIAGNOSIS — I10 Essential (primary) hypertension: Secondary | ICD-10-CM | POA: Diagnosis not present

## 2012-07-10 DIAGNOSIS — M810 Age-related osteoporosis without current pathological fracture: Secondary | ICD-10-CM | POA: Diagnosis not present

## 2012-07-16 ENCOUNTER — Ambulatory Visit (HOSPITAL_BASED_OUTPATIENT_CLINIC_OR_DEPARTMENT_OTHER): Payer: Medicare Other | Admitting: Physical Medicine & Rehabilitation

## 2012-07-16 ENCOUNTER — Encounter: Payer: Self-pay | Admitting: Physical Medicine & Rehabilitation

## 2012-07-16 VITALS — BP 157/92 | HR 76 | Resp 14 | Ht 64.0 in | Wt 154.8 lb

## 2012-07-16 DIAGNOSIS — G8929 Other chronic pain: Secondary | ICD-10-CM | POA: Diagnosis not present

## 2012-07-16 DIAGNOSIS — G894 Chronic pain syndrome: Secondary | ICD-10-CM | POA: Diagnosis not present

## 2012-07-16 DIAGNOSIS — M545 Low back pain: Secondary | ICD-10-CM | POA: Diagnosis not present

## 2012-07-16 DIAGNOSIS — Z86718 Personal history of other venous thrombosis and embolism: Secondary | ICD-10-CM | POA: Diagnosis not present

## 2012-07-16 DIAGNOSIS — M47817 Spondylosis without myelopathy or radiculopathy, lumbosacral region: Secondary | ICD-10-CM | POA: Diagnosis not present

## 2012-07-16 DIAGNOSIS — Z86711 Personal history of pulmonary embolism: Secondary | ICD-10-CM | POA: Diagnosis not present

## 2012-07-16 DIAGNOSIS — M549 Dorsalgia, unspecified: Secondary | ICD-10-CM | POA: Diagnosis not present

## 2012-07-16 NOTE — Patient Instructions (Addendum)
The physical therapy office should call you for a appointment to go over TENS unit If you do not hear from them please call our office I will see you back if you need another sacroiliac injection.

## 2012-07-16 NOTE — Progress Notes (Signed)
  Subjective:    Patient ID: Elizabeth [name], female    DOB: [birth year], 71 y.o.   MRN: [id number]  HPI R SI injection 06/27/12 Tried steroid wean for PMR Dr Clyde Canterbury Pain Inventory Average Pain 1 Pain Right Now 1 My pain is so much better  In the last 24 hours, has pain interfered with the following? General activity 1 Relation with others 2 Enjoyment of life 8 What TIME of day is your pain at its worst? morning Sleep (in general) Fair  Pain is worse with: bending, inactivity, standing and some activites Pain improves with: heat/ice and medication Relief from Meds: 8  Mobility walk without assistance how many minutes can you walk? 30 ability to climb steps?  yes do you drive?  yes  Function retired  Neuro/Psych spasms  Prior Studies Any changes since last visit?  no  Physicians involved in your care Any changes since last visit?  no   Family History  Problem Relation Age of Onset  . Cancer Daughter    History   Social History  . Marital Status: Divorced    Spouse Name: N/A    Number of Children: N/A  . Years of Education: N/A   Social History Main Topics  . Smoking status: Never Smoker   . Smokeless tobacco: Never Used  . Alcohol Use: No  . Drug Use: No  . Sexually Active: None   Other Topics Concern  . None   Social History Narrative  . None   Past Surgical History  Procedure Date  . Cholecystectomy    Past Medical History  Diagnosis Date  . Pulmonary embolus   . Lyme disease   . Blood clot in vein   . RSD (reflex sympathetic dystrophy)   . Fibromyalgia   . PMR (polymyalgia rheumatica)   . Thyroid disease    BP 157/92  Pulse 76  Resp 14  Ht 5\' 4"  (1.626 m)  Wt 154 lb 12.8 oz (70.217 kg)  BMI 26.57 kg/m2  SpO2 96%    Review of Systems  Musculoskeletal:       Spasms  All other systems reviewed and are negative.       Objective:   Physical Exam  Constitutional: She is oriented to person, place, and time. She appears  well-developed and well-nourished.  Musculoskeletal:       Lumbar back: Normal.  Neurological: She is alert and oriented to person, place, and time. She has normal strength. Gait normal.  Psychiatric: She has a normal mood and affect.          Assessment & Plan:  1.  Sacroiliac d/o improved after R SI injection 10/3.  Cannot predict duration of injection.  Pt may call to reschedule if severe returns Muscle spasms in mid back will order TENS, current unit is 70 yrs old

## 2012-08-13 DIAGNOSIS — E7800 Pure hypercholesterolemia, unspecified: Secondary | ICD-10-CM | POA: Diagnosis not present

## 2012-08-13 DIAGNOSIS — Z7901 Long term (current) use of anticoagulants: Secondary | ICD-10-CM | POA: Diagnosis not present

## 2012-08-13 DIAGNOSIS — Z79899 Other long term (current) drug therapy: Secondary | ICD-10-CM | POA: Diagnosis not present

## 2012-08-20 ENCOUNTER — Ambulatory Visit (HOSPITAL_BASED_OUTPATIENT_CLINIC_OR_DEPARTMENT_OTHER): Payer: Medicare Other | Admitting: Physical Medicine & Rehabilitation

## 2012-08-20 ENCOUNTER — Encounter: Payer: Self-pay | Admitting: Physical Medicine & Rehabilitation

## 2012-08-20 ENCOUNTER — Encounter: Payer: Medicare Other | Attending: Physical Medicine & Rehabilitation

## 2012-08-20 VITALS — BP 141/88 | HR 71 | Resp 14 | Ht 64.0 in | Wt 152.0 lb

## 2012-08-20 DIAGNOSIS — IMO0001 Reserved for inherently not codable concepts without codable children: Secondary | ICD-10-CM

## 2012-08-20 DIAGNOSIS — M47817 Spondylosis without myelopathy or radiculopathy, lumbosacral region: Secondary | ICD-10-CM | POA: Insufficient documentation

## 2012-08-20 DIAGNOSIS — M545 Low back pain, unspecified: Secondary | ICD-10-CM | POA: Diagnosis not present

## 2012-08-20 DIAGNOSIS — Z86711 Personal history of pulmonary embolism: Secondary | ICD-10-CM | POA: Insufficient documentation

## 2012-08-20 DIAGNOSIS — Z7901 Long term (current) use of anticoagulants: Secondary | ICD-10-CM | POA: Diagnosis not present

## 2012-08-20 DIAGNOSIS — G894 Chronic pain syndrome: Secondary | ICD-10-CM | POA: Diagnosis not present

## 2012-08-20 DIAGNOSIS — G8929 Other chronic pain: Secondary | ICD-10-CM | POA: Diagnosis not present

## 2012-08-20 DIAGNOSIS — Z86718 Personal history of other venous thrombosis and embolism: Secondary | ICD-10-CM | POA: Diagnosis not present

## 2012-08-20 NOTE — Patient Instructions (Signed)
Call me if pain does not get any better and we will need to schedule you for repeat sacroiliac injection. We would need you to come off her Coumadin prior to that time and check PT/INR within 24 hours of the injection  After your trigger point injection he may use heating pad one half hour 3-4 times per day Tomorrow he may start with some stretching 2 the lumbar spine. I will give you some exercises that he can try

## 2012-08-20 NOTE — Progress Notes (Signed)
Subjective:    Patient ID: Elizabeth [name], female    DOB: 12-23-1940, 71 y.o.   MRN: [id number]  HPI Low back pain different spot than prior No falls. Bending forward seems to make it worse. Vacuuming is very difficult. Patient had right sacroiliac injection 06/27/2012 with good relief Patient had bilateral L2-L3 L4-L5 medial branch blocks 05/30/2012  Pain Inventory Average Pain 8 Pain Right Now 8 My pain is constant, burning, dull and aching  In the last 24 hours, has pain interfered with the following? General activity 7 Relation with others 7 Enjoyment of life 7 What TIME of day is your pain at its worst? evening Sleep (in general) Fair  Pain is worse with: bending, sitting and standing Pain improves with: heat/ice Relief from Meds: 5  Mobility walk without assistance ability to climb steps?  yes do you drive?  yes  Function not employed: date last employed   Neuro/Psych spasms  Prior Studies Any changes since last visit?  no  Physicians involved in your care Any changes since last visit?  no   Family History  Problem Relation Age of Onset  . Cancer Daughter    History   Social History  . Marital Status: Divorced    Spouse Name: N/A    Number of Children: N/A  . Years of Education: N/A   Social History Main Topics  . Smoking status: Never Smoker   . Smokeless tobacco: Never Used  . Alcohol Use: No  . Drug Use: No  . Sexually Active: None   Other Topics Concern  . None   Social History Narrative  . None   Past Surgical History  Procedure Date  . Cholecystectomy    Past Medical History  Diagnosis Date  . Pulmonary embolus   . Lyme disease   . Blood clot in vein   . RSD (reflex sympathetic dystrophy)   . Fibromyalgia   . PMR (polymyalgia rheumatica)   . Thyroid disease    BP 141/88  Pulse 71  Resp 14  Ht 5\' 4"  (1.626 m)  Wt 152 lb (68.947 kg)  BMI 26.09 kg/m2  SpO2 98%   Review of Systems  Musculoskeletal: Positive for back  pain.       Spasms  All other systems reviewed and are negative.       Objective:   Physical Exam  Constitutional: She is oriented to person, place, and time. She appears well-developed.  HENT:  Head: Normocephalic and atraumatic.  Eyes: Conjunctivae normal and EOM are normal. Pupils are equal, round, and reactive to light.  Neck: Normal range of motion.  Neurological: She is alert and oriented to person, place, and time. She has normal strength and normal reflexes. No sensory deficit. Gait normal.  Psychiatric: She has a normal mood and affect.     Tenderness on right greater than left PSIS area No tenderness along the lumbar paraspinals.Lumbar range of motion Reduced flexion extension lateral tension and  bending     Assessment & Plan:  1. Lumbar spondylosis still responding to L2-L3-L4 medial branches and L5 dorsal ramus injection 2. Sacroiliac disorder right side improved overall however some of her recurrent pain may be a sign of the injection starting to wear off  3. Myofascial pain despite stretching and other conservative care will trial trigger point injections she's had benefit in the past with trigger point injections at another clinic  Procedure trigger point injections Informed consent was obtained after describing risks and benefits of the procedure  with the patient is include bleeding bruising and infection she elects to proceed and has given written consent Patient placed in a prone position PSIS area were marked and prepped 1 cc of 1% lidocaine injected into each side patient tolerated procedure Post procedure instructions given

## 2012-08-27 DIAGNOSIS — M545 Low back pain: Secondary | ICD-10-CM | POA: Diagnosis not present

## 2012-08-27 DIAGNOSIS — F411 Generalized anxiety disorder: Secondary | ICD-10-CM | POA: Diagnosis not present

## 2012-08-27 DIAGNOSIS — J45909 Unspecified asthma, uncomplicated: Secondary | ICD-10-CM | POA: Diagnosis not present

## 2012-08-27 DIAGNOSIS — T81718A Complication of other artery following a procedure, not elsewhere classified, initial encounter: Secondary | ICD-10-CM | POA: Diagnosis not present

## 2012-08-27 DIAGNOSIS — M353 Polymyalgia rheumatica: Secondary | ICD-10-CM | POA: Diagnosis not present

## 2012-08-27 DIAGNOSIS — Z7901 Long term (current) use of anticoagulants: Secondary | ICD-10-CM | POA: Diagnosis not present

## 2012-08-27 DIAGNOSIS — I2699 Other pulmonary embolism without acute cor pulmonale: Secondary | ICD-10-CM | POA: Diagnosis not present

## 2012-09-05 ENCOUNTER — Telehealth: Payer: Self-pay | Admitting: *Deleted

## 2012-09-05 NOTE — Telephone Encounter (Signed)
Patient called to check on scheduling on repeat Sacroiliac Injection.

## 2012-09-06 NOTE — Telephone Encounter (Signed)
Appointment has been scheduled for Tuesday, December 17.

## 2012-09-10 ENCOUNTER — Ambulatory Visit (HOSPITAL_BASED_OUTPATIENT_CLINIC_OR_DEPARTMENT_OTHER): Payer: Medicare Other | Admitting: Physical Medicine & Rehabilitation

## 2012-09-10 ENCOUNTER — Encounter: Payer: Self-pay | Admitting: Physical Medicine & Rehabilitation

## 2012-09-10 ENCOUNTER — Encounter: Payer: Medicare Other | Attending: Physical Medicine & Rehabilitation

## 2012-09-10 VITALS — BP 131/78 | HR 76 | Resp 14 | Ht 64.0 in | Wt 147.0 lb

## 2012-09-10 DIAGNOSIS — Z7901 Long term (current) use of anticoagulants: Secondary | ICD-10-CM | POA: Insufficient documentation

## 2012-09-10 DIAGNOSIS — Z86718 Personal history of other venous thrombosis and embolism: Secondary | ICD-10-CM | POA: Diagnosis not present

## 2012-09-10 DIAGNOSIS — G8929 Other chronic pain: Secondary | ICD-10-CM | POA: Insufficient documentation

## 2012-09-10 DIAGNOSIS — G894 Chronic pain syndrome: Secondary | ICD-10-CM | POA: Diagnosis not present

## 2012-09-10 DIAGNOSIS — M545 Low back pain, unspecified: Secondary | ICD-10-CM | POA: Insufficient documentation

## 2012-09-10 DIAGNOSIS — Z86711 Personal history of pulmonary embolism: Secondary | ICD-10-CM | POA: Insufficient documentation

## 2012-09-10 DIAGNOSIS — M533 Sacrococcygeal disorders, not elsewhere classified: Secondary | ICD-10-CM

## 2012-09-10 DIAGNOSIS — M47817 Spondylosis without myelopathy or radiculopathy, lumbosacral region: Secondary | ICD-10-CM | POA: Insufficient documentation

## 2012-09-10 NOTE — Progress Notes (Signed)
.    PROCEDURE RECORD [facility]   Name: Elizabeth [name] DOB:[birth year] MRN: [id number]  Date:09/10/2012  Physician: Claudette Laws, MD    Nurse/CMA: Shumaker RN  Allergies:  Allergies  Allergen Reactions  . Ivp Dye (Iodinated Diagnostic Agents) Anaphylaxis  . Codeine   . Sulfamethoxazole W-Trimethoprim     Consent Signed: yes  Is patient diabetic? no  CBG today?   Pregnant: no LMP: No LMP recorded. Patient is postmenopausal. (age 2-55)  Anticoagulants: stopped 5 days ago   INR 1.0 Anti-inflammatory: no Antibiotics: no  Procedure: right Sacroiliac Steroid Injection Position: Prone Start Time: 12:44 End Time: 12:46 Fluoro Time: 7 seconds  RN/CMA shumaker rn shumaker rn    Time 12:10p 12:50    BP 131/78 155/95    Pulse 76 84    Respirations 14 14    O2 Sat 96 96    S/S 6 6    Pain Level 6/10 6/10     D/C home with Consuella Lose, patient A & O X 3, D/C instructions reviewed, and sits independently.

## 2012-09-10 NOTE — Patient Instructions (Signed)
Sacroiliac Joint Dysfunction The sacroiliac joint connects the lower part of the spine (the sacrum) with the bones of the pelvis. CAUSES  Sometimes, there is no obvious reason for sacroiliac joint dysfunction. Other times, it may occur   During pregnancy.  After injury, such as:  Car accidents.  Sport-related injuries.  Work-related injuries.  Due to one leg being shorter than the other.  Due to other conditions that affect the joints, such as:  Rheumatoid arthritis.  Gout.  Psoriasis.  Joint infection (septic arthritis). SYMPTOMS  Symptoms may include:  Pain in the:  Lower back.  Buttocks.  Groin.  Thighs and legs.  Difficult sitting, standing, walking, lying, bending or lifting. DIAGNOSIS  A number of tests may be used to help diagnose the cause of sacroiliac joint dysfunction, including:  Imaging tests to look for other causes of pain, including:  MRI.  CT scan.  Bone scan.  Diagnostic injection: During a special x-ray (called fluoroscopy), a needle is put into the sacroiliac joint. A numbing medicine is injected into the joint. If the pain is improved or stopped, the diagnosis of sacroiliac joint dysfunction is more likely. TREATMENT  There are a number of types of treatment used for sacroiliac joint dysfunction, including:  Only take over-the-counter or prescription medicines for pain, discomfort, or fever as directed by your caregiver.  Medications to relax muscles.  Rest. Decreasing activity can help cut down on painful muscle spasms and allow the back to heal.  Application of heat or ice to the lower back may improve muscle spasms and soothe pain.  Brace. A special back brace, called a sacroiliac belt, can help support the joint while your back is healing.  Physical therapy can help teach comfortable positions and exercises to strengthen muscles that support the sacroiliac joint.  Cortisone injections. Injections of steroid medicine into the  joint can help decrease swelling and improve pain.  Hyaluronic acid injections. This chemical improves lubrication within the sacroiliac joint, thereby decreasing pain.  Radiofrequency ablation. A special needle is placed into the joint, where it burns away nerves that are carrying pain messages from the joint.  Surgery. Because pain occurs during movement of the joint, screws and plates may be installed in order to limit or prevent joint motion. HOME CARE INSTRUCTIONS   Take all medications exactly as directed.  Follow instructions regarding both rest and physical activity, to avoid worsening the pain.  Do physical therapy exercises exactly as prescribed. SEEK IMMEDIATE MEDICAL CARE IF:  You experience increasingly severe pain.  You develop new symptoms, such as numbness or tingling in your legs or feet.  You lose bladder or bowel control. Document Released: 12/08/2008 Document Revised: 12/04/2011 Document Reviewed: 12/08/2008 [employer] Patient Information 2013 [employer].  

## 2012-09-10 NOTE — Progress Notes (Signed)
  Subjective:    Patient ID: Elizabeth [name], female    DOB: August 04, 1941, 71 y.o.   MRN: [id number]  HPI Increased R side low back pain  Review of Systems     Objective:   Physical Exam  Pain over R PSIS     Assessment & Plan:  Right sacroiliac injection under fluoroscopic guidance  Indication: Right Low back and buttocks pain not relieved by medication management and other conservative care.  Informed consent was obtained after describing risks and benefits of the procedure with the patient, this includes bleeding, bruising, infection, paralysis and medication side effects. The patient wishes to proceed and has given written consent. The patient was placed in a prone position. The lumbar and sacral area was marked and prepped with Betadine. A 25-gauge 1-1/2 inch needle was inserted into the skin and subcutaneous tissue and 1 mL of 1% lidocaine was injected. Then a 25-gauge 3 inch spinal needle was inserted under fluoroscopic guidance into the Right sacroiliac joint. AP and lateral images were utilized. Omnipaque 180x0.5 mL under live fluoroscopy demonstrated no intravascular uptake. Then a solution containing one ML of 40 mg per mL depomedrol and 2 ML of 1% lidocaine MPF was injected x1.5 mL. Patient tolerated the procedure well. Post procedure instructions were given. Please see post procedure form.

## 2012-09-30 DIAGNOSIS — Z7901 Long term (current) use of anticoagulants: Secondary | ICD-10-CM | POA: Diagnosis not present

## 2012-11-01 DIAGNOSIS — M353 Polymyalgia rheumatica: Secondary | ICD-10-CM | POA: Diagnosis not present

## 2012-11-01 DIAGNOSIS — M25559 Pain in unspecified hip: Secondary | ICD-10-CM | POA: Diagnosis not present

## 2012-11-01 DIAGNOSIS — Z7901 Long term (current) use of anticoagulants: Secondary | ICD-10-CM | POA: Diagnosis not present

## 2012-11-01 DIAGNOSIS — T81718A Complication of other artery following a procedure, not elsewhere classified, initial encounter: Secondary | ICD-10-CM | POA: Diagnosis not present

## 2012-11-01 DIAGNOSIS — F329 Major depressive disorder, single episode, unspecified: Secondary | ICD-10-CM | POA: Diagnosis not present

## 2012-11-25 DIAGNOSIS — Z7901 Long term (current) use of anticoagulants: Secondary | ICD-10-CM | POA: Diagnosis not present

## 2012-12-04 DIAGNOSIS — Z7901 Long term (current) use of anticoagulants: Secondary | ICD-10-CM | POA: Diagnosis not present

## 2012-12-05 ENCOUNTER — Encounter: Payer: Medicare Other | Attending: Physical Medicine & Rehabilitation

## 2012-12-05 ENCOUNTER — Ambulatory Visit (HOSPITAL_BASED_OUTPATIENT_CLINIC_OR_DEPARTMENT_OTHER): Payer: Medicare Other | Admitting: Physical Medicine & Rehabilitation

## 2012-12-05 ENCOUNTER — Encounter: Payer: Self-pay | Admitting: Physical Medicine & Rehabilitation

## 2012-12-05 VITALS — BP 140/80 | HR 90 | Resp 14 | Ht 64.0 in | Wt 149.8 lb

## 2012-12-05 DIAGNOSIS — G8929 Other chronic pain: Secondary | ICD-10-CM | POA: Diagnosis not present

## 2012-12-05 DIAGNOSIS — M545 Low back pain, unspecified: Secondary | ICD-10-CM | POA: Insufficient documentation

## 2012-12-05 DIAGNOSIS — G894 Chronic pain syndrome: Secondary | ICD-10-CM | POA: Diagnosis not present

## 2012-12-05 DIAGNOSIS — Z86711 Personal history of pulmonary embolism: Secondary | ICD-10-CM | POA: Diagnosis not present

## 2012-12-05 DIAGNOSIS — Z7901 Long term (current) use of anticoagulants: Secondary | ICD-10-CM | POA: Diagnosis not present

## 2012-12-05 DIAGNOSIS — M47817 Spondylosis without myelopathy or radiculopathy, lumbosacral region: Secondary | ICD-10-CM | POA: Insufficient documentation

## 2012-12-05 DIAGNOSIS — Z86718 Personal history of other venous thrombosis and embolism: Secondary | ICD-10-CM | POA: Insufficient documentation

## 2012-12-05 NOTE — Progress Notes (Signed)
  PROCEDURE RECORD [facility]   Name: Elizabeth [name] DOB:[birth year] MRN: [id number]  Date:12/05/2012  Physician: Claudette Laws, MD    Nurse/CMA: Shumaker RN  Allergies:  Allergies  Allergen Reactions  . Ivp Dye (Iodinated Diagnostic Agents) Anaphylaxis  . Codeine   . Sulfamethoxazole W-Trimethoprim     Consent Signed: yes  Is patient diabetic? no  CBG today?   Pregnant: no LMP: No LMP recorded. Patient is postmenopausal. (age 45-55)  Anticoagulants: yes (coumadin-stopped INR 1.1  12/04/12) Anti-inflammatory: no Antibiotics: no  Procedure: right medial branch block  Position: Prone Start Time: 10:00 End Time: 10:07 Fluoro Time: 15 seconds  RN/CMA Haematologist RN    Time 9:26     BP 140/80     Pulse 90     Respirations 14     O2 Sat 98     S/S 6 6    Pain Level 7/10 5/10     D/C home with Sister in law, patient A & O X 3, D/C instructions reviewed, and sits independently.

## 2012-12-05 NOTE — Patient Instructions (Signed)
We injected Right lumbar  facet joints withCortisone medicine as well as lidocaine. If this is helpful for your pain we can repeat in 3 months. He'll need to continue getting your oxycodone medicines from Dr. Gerri Spore for one of her colleagues

## 2012-12-05 NOTE — Progress Notes (Signed)
Right lumbar L3, L4 medial branch blocks and L5 dorsal ramus injection under fluoroscopic guidance  Indication: Right Lumbar pain which is not relieved by medication management or other conservative care and interfering with self-care and mobility.  Informed consent was obtained after describing risks and benefits of the procedure with the patient, this includes bleeding, bruising, infection, paralysis and medication side effects. The patient wishes to proceed and has given written consent. The patient was placed in a prone position. The lumbar area was marked and prepped with Betadine. One ML of 1% lidocaine was injected into each of 3 areas into the skin and subcutaneous tissue. Then a 22-gauge 3.5 inch spinal needle was inserted targeting the junction of the Right S1 superior articular process and sacral ala junction. Needle was advanced under fluoroscopic guidance. Bone contact was made. Omnipaque 180 was injected x0.5 mL demonstrating no intravascular uptake. Then a solution containing one ML of 4 mg per mL dexamethasone and 3 mL of 2% MPF lidocaine was injected x0.5 mL. Then the Right L5 superior articular process in transverse process junction was targeted. Bone contact was made. Omnipaque 180 was injected x0.5 mL demonstrating no intravascular uptake. Then a solution containing one ML of 4 mg per mL dexamethasone and 3 mL of 2% MPF lidocaine was injected x0.5 mL. Then the Right L4 superior articular process in transverse process junction was targeted. Bone contact was made. Omnipaque 180 was injected x0.5 mL demonstrating no intravascular uptake. Then a solution containing one ML of 4 mg per mL dexamethasone and 3 mL of 2% MPF lidocaine was injected x0.5 mL Patient tolerated procedure well. Post procedure instructions were given. Please refer to post procedure form. 

## 2012-12-10 DIAGNOSIS — Z7901 Long term (current) use of anticoagulants: Secondary | ICD-10-CM | POA: Diagnosis not present

## 2013-01-24 DIAGNOSIS — M2550 Pain in unspecified joint: Secondary | ICD-10-CM | POA: Diagnosis not present

## 2013-01-24 DIAGNOSIS — Z7901 Long term (current) use of anticoagulants: Secondary | ICD-10-CM | POA: Diagnosis not present

## 2013-01-24 DIAGNOSIS — M949 Disorder of cartilage, unspecified: Secondary | ICD-10-CM | POA: Diagnosis not present

## 2013-01-24 DIAGNOSIS — IMO0001 Reserved for inherently not codable concepts without codable children: Secondary | ICD-10-CM | POA: Diagnosis not present

## 2013-01-24 DIAGNOSIS — I2699 Other pulmonary embolism without acute cor pulmonale: Secondary | ICD-10-CM | POA: Diagnosis not present

## 2013-01-24 DIAGNOSIS — E039 Hypothyroidism, unspecified: Secondary | ICD-10-CM | POA: Diagnosis not present

## 2013-01-24 DIAGNOSIS — M899 Disorder of bone, unspecified: Secondary | ICD-10-CM | POA: Diagnosis not present

## 2013-01-24 DIAGNOSIS — T81718A Complication of other artery following a procedure, not elsewhere classified, initial encounter: Secondary | ICD-10-CM | POA: Diagnosis not present

## 2013-02-21 DIAGNOSIS — M940 Chondrocostal junction syndrome [Tietze]: Secondary | ICD-10-CM | POA: Diagnosis not present

## 2013-02-21 DIAGNOSIS — E039 Hypothyroidism, unspecified: Secondary | ICD-10-CM | POA: Diagnosis not present

## 2013-02-21 DIAGNOSIS — I2699 Other pulmonary embolism without acute cor pulmonale: Secondary | ICD-10-CM | POA: Diagnosis not present

## 2013-02-21 DIAGNOSIS — Z7901 Long term (current) use of anticoagulants: Secondary | ICD-10-CM | POA: Diagnosis not present

## 2013-02-21 DIAGNOSIS — G894 Chronic pain syndrome: Secondary | ICD-10-CM | POA: Diagnosis not present

## 2013-02-21 DIAGNOSIS — F411 Generalized anxiety disorder: Secondary | ICD-10-CM | POA: Diagnosis not present

## 2013-03-06 ENCOUNTER — Ambulatory Visit: Payer: Medicare Other | Admitting: Physical Medicine & Rehabilitation

## 2013-03-19 DIAGNOSIS — G894 Chronic pain syndrome: Secondary | ICD-10-CM | POA: Diagnosis not present

## 2013-03-19 DIAGNOSIS — F329 Major depressive disorder, single episode, unspecified: Secondary | ICD-10-CM | POA: Diagnosis not present

## 2013-03-19 DIAGNOSIS — M353 Polymyalgia rheumatica: Secondary | ICD-10-CM | POA: Diagnosis not present

## 2013-03-19 DIAGNOSIS — I2699 Other pulmonary embolism without acute cor pulmonale: Secondary | ICD-10-CM | POA: Diagnosis not present

## 2013-03-19 DIAGNOSIS — Z7901 Long term (current) use of anticoagulants: Secondary | ICD-10-CM | POA: Diagnosis not present

## 2013-04-11 DIAGNOSIS — F411 Generalized anxiety disorder: Secondary | ICD-10-CM | POA: Diagnosis not present

## 2013-04-11 DIAGNOSIS — J411 Mucopurulent chronic bronchitis: Secondary | ICD-10-CM | POA: Diagnosis not present

## 2013-04-11 DIAGNOSIS — J45901 Unspecified asthma with (acute) exacerbation: Secondary | ICD-10-CM | POA: Diagnosis not present

## 2013-04-15 DIAGNOSIS — I2699 Other pulmonary embolism without acute cor pulmonale: Secondary | ICD-10-CM | POA: Diagnosis not present

## 2013-04-15 DIAGNOSIS — J45901 Unspecified asthma with (acute) exacerbation: Secondary | ICD-10-CM | POA: Diagnosis not present

## 2013-04-15 DIAGNOSIS — Z7901 Long term (current) use of anticoagulants: Secondary | ICD-10-CM | POA: Diagnosis not present

## 2013-04-15 DIAGNOSIS — T81718A Complication of other artery following a procedure, not elsewhere classified, initial encounter: Secondary | ICD-10-CM | POA: Diagnosis not present

## 2013-04-15 DIAGNOSIS — M2550 Pain in unspecified joint: Secondary | ICD-10-CM | POA: Diagnosis not present

## 2013-04-15 DIAGNOSIS — M353 Polymyalgia rheumatica: Secondary | ICD-10-CM | POA: Diagnosis not present

## 2013-05-11 ENCOUNTER — Emergency Department (HOSPITAL_BASED_OUTPATIENT_CLINIC_OR_DEPARTMENT_OTHER): Payer: Medicare Other

## 2013-05-11 ENCOUNTER — Emergency Department (HOSPITAL_BASED_OUTPATIENT_CLINIC_OR_DEPARTMENT_OTHER)
Admission: EM | Admit: 2013-05-11 | Discharge: 2013-05-11 | Disposition: A | Discharge status: Home / Self Care | Payer: Medicare Other | Attending: Emergency Medicine | Admitting: Emergency Medicine

## 2013-05-11 DIAGNOSIS — Z8739 Personal history of other diseases of the musculoskeletal system and connective tissue: Secondary | ICD-10-CM | POA: Insufficient documentation

## 2013-05-11 DIAGNOSIS — Y9339 Activity, other involving climbing, rappelling and jumping off: Secondary | ICD-10-CM | POA: Insufficient documentation

## 2013-05-11 DIAGNOSIS — S32009A Unspecified fracture of unspecified lumbar vertebra, initial encounter for closed fracture: Secondary | ICD-10-CM | POA: Diagnosis not present

## 2013-05-11 DIAGNOSIS — Y929 Unspecified place or not applicable: Secondary | ICD-10-CM | POA: Insufficient documentation

## 2013-05-11 DIAGNOSIS — Z8619 Personal history of other infectious and parasitic diseases: Secondary | ICD-10-CM | POA: Diagnosis not present

## 2013-05-11 DIAGNOSIS — Z8669 Personal history of other diseases of the nervous system and sense organs: Secondary | ICD-10-CM | POA: Diagnosis not present

## 2013-05-11 DIAGNOSIS — Z79899 Other long term (current) drug therapy: Secondary | ICD-10-CM | POA: Diagnosis not present

## 2013-05-11 DIAGNOSIS — Z8679 Personal history of other diseases of the circulatory system: Secondary | ICD-10-CM | POA: Diagnosis not present

## 2013-05-11 DIAGNOSIS — Z86711 Personal history of pulmonary embolism: Secondary | ICD-10-CM | POA: Diagnosis not present

## 2013-05-11 DIAGNOSIS — E079 Disorder of thyroid, unspecified: Secondary | ICD-10-CM | POA: Diagnosis not present

## 2013-05-11 DIAGNOSIS — W1789XA Other fall from one level to another, initial encounter: Secondary | ICD-10-CM | POA: Insufficient documentation

## 2013-05-11 MED ORDER — HYDROMORPHONE HCL PF 1 MG/ML IJ SOLN
1.0000 mg | Freq: Once | INTRAMUSCULAR | Status: AC
  Administered 2013-05-11: 1 mg via INTRAMUSCULAR
  Filled 2013-05-11: qty 1

## 2013-05-11 MED ORDER — ONDANSETRON 4 MG PO TBDP
4.0000 mg | ORAL_TABLET | Freq: Once | ORAL | Status: AC
  Administered 2013-05-11: 4 mg via ORAL
  Filled 2013-05-11: qty 1

## 2013-05-11 MED ORDER — ONDANSETRON 4 MG PO TBDP: 4.0000 mg | ORAL_TABLET | Freq: Once | ORAL | Status: AC

## 2013-05-11 NOTE — ED Notes (Signed)
Pt have been on percocet 1988 QID for chronic pain

## 2013-05-11 NOTE — ED Provider Notes (Signed)
CSN: [id number]     Arrival date & time 05/11/13  4098 History     First MD Initiated Contact with Patient 05/11/13 1132     Chief Complaint  Patient presents with  . Back Pain   (Consider location/radiation/quality/duration/timing/severity/associated sxs/prior Treatment) HPI Pt presenting with low back pain after fall when climbing over a fence 4 days ago. She states she had locked herself out of her house and was climbing over a fence.  She fell flat on her back.  Over the past several days she has had peristent pain that did not improve prompting ED visit.  Pain is worse with sitting position, no weakness of legs, no retention of urine, no incontinence or fever.  There are no other associated systemic symptoms, there are no other alleviating or modifying factors.   Past Medical History  Diagnosis Date  . Pulmonary embolus   . Lyme disease   . Blood clot in vein   . RSD (reflex sympathetic dystrophy)   . Fibromyalgia   . PMR (polymyalgia rheumatica)   . Thyroid disease    Past Surgical History  Procedure Laterality Date  . Cholecystectomy     Family History  Problem Relation Age of Onset  . Cancer Daughter    History  Substance Use Topics  . Smoking status: Never Smoker   . Smokeless tobacco: Never Used  . Alcohol Use: No   OB History   Grav Para Term Preterm Abortions TAB SAB Ect Mult Living                 Review of Systems ROS reviewed and all otherwise negative except for mentioned in HPI  Allergies  Ivp dye; Codeine; and Sulfamethoxazole w-trimethoprim  Home Medications   Current Outpatient Rx  Name  Route  Sig  Dispense  Refill  . alendronate (FOSAMAX) 70 MG tablet   Oral   Take 70 mg by mouth every 7 (seven) days. Take with a full glass of water on an empty stomach.         . citalopram (CELEXA) 20 MG tablet   Oral   Take 30 mg by mouth daily.         . cyclobenzaprine (FLEXERIL) 10 MG tablet   Oral   Take 1 tablet by mouth 3 (three) times  daily.         Marland Kitchen levothyroxine (SYNTHROID, LEVOTHROID) 88 MCG tablet   Oral   Take 88 mcg by mouth daily.         Marland Kitchen lidocaine (LIDODERM) 5 %   Transdermal   Place 1 patch onto the skin daily. Remove & Discard patch within 12 hours or as directed by MD         . oxyCODONE-acetaminophen (PERCOCET) 5-325 MG per tablet   Oral   Take 1 tablet by mouth every 4 (four) hours as needed. Patient used this medication for pain.         . prednisoLONE 5 MG TABS   Oral   Take 5 mg by mouth daily.         . simvastatin (ZOCOR) 10 MG tablet   Oral   Take 10 mg by mouth at bedtime.         . verapamil (COVERA HS) 240 MG (CO) 24 hr tablet   Oral   Take 80 mg by mouth 3 (three) times daily.         Marland Kitchen warfarin (COUMADIN) 5 MG tablet   Oral  Take 5 mg by mouth daily. Anti-coagulant.  Consult needed.          BP 176/99  Pulse 71  Temp(Src) 97.2 F (36.2 C) (Oral)  Resp 18  Ht 5\' 4"  (1.626 m)  Wt 137 lb (62.143 kg)  BMI 23.5 kg/m2  SpO2 94% Vitals reviewed Physical Exam Physical Examination: General appearance - alert, well appearing, and in no distress Mental status - alert, oriented to person, place, and time Eyes - pupils equal and reactive, extraocular eye movements intact Mouth - mucous membranes moist, pharynx normal without lesions Chest - clear to auscultation, no wheezes, rales or rhonchi, symmetric air entry Heart - normal rate, regular rhythm, normal S1, S2, no murmurs, rubs, clicks or gallops Abdomen - soft, nontender, nondistended, no masses or organomegaly Back exam - full range of motion, no tenderness, palpable spasm or pain on motion Neurological - alert, oriented, normal speech, no focal findings or movement disorder noted Musculoskeletal - no joint tenderness, deformity or swelling Extremities - peripheral pulses normal, no pedal edema, no clubbing or cyanosis Skin - normal coloration and turgor, no rashes, no suspicious skin lesions noted  ED  Course   Procedures (including critical care time)  12:48 PM d/w Dr. Jeral Fruit, he recommends TLSO and outpatient followup with him.    2:09 PM pt has had TLSO placed  Labs Reviewed - No data to display Dg Lumbar Spine Complete  05/11/2013   *RADIOLOGY REPORT*  Clinical Data: Fall 3 days ago.  Low back pain.  LUMBAR SPINE - COMPLETE 4+ VIEW  Comparison: 03/08/2012  Findings: There are old minor compression deformities at L1 and L4. There is a newly seen superior endplate compression fracture at L5. There is disc space narrowing and facet degeneration in the lumbar spine.  There is curvature convex to the right.  IVC filter is in place.  IMPRESSION: Newly seen superior endplate compression fracture at L5 with loss of height of only about 20%.  Old appearing compression deformities at L1 and L4 as seen in 2013.   Original Report Authenticated By: Paulina Fusi, M.D.   1. Lumbar vertebral fracture, closed, initial encounter     MDM  Pt presenting with c/o low back pain after fall several days ago.  She has evidence of new L5 compression fracture on xray.  No neurological findings.  Pt given pain meds in the ED (she is under pain management for further pain control)  TLSO placed by [employer] and pt to follow up with Dr Jeral Fruit.  Discharged with strict return precautions.  Pt agreeable with plan.  Ethelda Chick, MD 05/11/13 [phone]

## 2013-05-11 NOTE — ED Notes (Signed)
On 08/13th, pt climbed and jumped over a 5 ft fence and fell on on her back; because she locked herself of the house. Thumbing lower back pain persist since the fall. Ambulatory. Denied any complications from the fall.

## 2013-05-11 NOTE — ED Notes (Signed)
Currently waiting for [employer] for TLSO Brace

## 2013-05-11 NOTE — ED Notes (Signed)
[employer] at bedside 

## 2013-05-12 ENCOUNTER — Encounter: Payer: Self-pay | Admitting: Physical Medicine & Rehabilitation

## 2013-05-12 ENCOUNTER — Ambulatory Visit (HOSPITAL_BASED_OUTPATIENT_CLINIC_OR_DEPARTMENT_OTHER): Payer: Medicare Other | Admitting: Physical Medicine & Rehabilitation

## 2013-05-12 ENCOUNTER — Encounter: Payer: Medicare Other | Attending: Physical Medicine & Rehabilitation

## 2013-05-12 VITALS — BP 125/78 | HR 84 | Resp 14 | Ht 64.0 in | Wt 146.0 lb

## 2013-05-12 DIAGNOSIS — S32000A Wedge compression fracture of unspecified lumbar vertebra, initial encounter for closed fracture: Secondary | ICD-10-CM

## 2013-05-12 DIAGNOSIS — Z7901 Long term (current) use of anticoagulants: Secondary | ICD-10-CM | POA: Diagnosis not present

## 2013-05-12 DIAGNOSIS — W1789XA Other fall from one level to another, initial encounter: Secondary | ICD-10-CM | POA: Insufficient documentation

## 2013-05-12 DIAGNOSIS — S32009A Unspecified fracture of unspecified lumbar vertebra, initial encounter for closed fracture: Secondary | ICD-10-CM

## 2013-05-12 MED ORDER — OXYCODONE-ACETAMINOPHEN 5-325 MG PO TABS: 1.0000 | ORAL_TABLET | Freq: Three times a day (TID) | ORAL | Status: DC | PRN

## 2013-05-12 NOTE — Progress Notes (Signed)
  Subjective:    Patient ID: Elizabeth [name], female    DOB: 29-Jul-1941, 72 y.o.   MRN: [id number]  HPI On 05/11/2013 was trying to climb a fence and fell backwards onto her back. X-rays demonstrated a 20% compression fracture of L5. Seen in the emergency department, recommended to see  neurosurgeon. Placed in a extension orthosis thoraciclumbar sacral. No bowel or bladder problems no new weakness in the legs no new numbness or tingling in the legs. Pain Inventory Average Pain 9 Pain Right Now 9 My pain is constant and dull  In the last 24 hours, has pain interfered with the following? General activity 8 Relation with others 7 Enjoyment of life 7 What TIME of day is your pain at its worst? all Sleep (in general) Poor  Pain is worse with: walking, bending, sitting and standing Pain improves with: medication, injections and brace Relief from Meds: 4  Mobility ability to climb steps?  yes do you drive?  no  Function retired  Neuro/Psych trouble walking  Prior Studies x-rays  Physicians involved in your care Any changes since last visit?  no   Family History  Problem Relation Age of Onset  . Cancer Daughter    History   Social History  . Marital Status: Divorced    Spouse Name: N/A    Number of Children: N/A  . Years of Education: N/A   Social History Main Topics  . Smoking status: Never Smoker   . Smokeless tobacco: Never Used  . Alcohol Use: No  . Drug Use: No  . Sexual Activity: None   Other Topics Concern  . None   Social History Narrative  . None   Past Surgical History  Procedure Laterality Date  . Cholecystectomy     Past Medical History  Diagnosis Date  . Pulmonary embolus   . Lyme disease   . Blood clot in vein   . RSD (reflex sympathetic dystrophy)   . Fibromyalgia   . PMR (polymyalgia rheumatica)   . Thyroid disease    BP 125/78  Pulse 84  Resp 14  Ht 5\' 4"  (1.626 m)  Wt 146 lb (66.225 kg)  BMI 25.05 kg/m2  SpO2  95%    Review of Systems  Musculoskeletal: Positive for back pain and gait problem.  All other systems reviewed and are negative.       Objective:   Physical Exam  Lumbar spine has point tenderness over the L5 spinous process. Paraspinal muscle tenderness extending into the lower thoracic area  motor strength is normal in the lower extremities Lumbar range of motion reduced in all directions to about 50% of normal Sensation is normal in both lower extremities Deep tendon reflexes are normal in both lower extremities Negative straight leg raising test      Assessment & Plan:  1. L5 superior endplate compression fracture acute. 20% compression. No retro-pulsion noted. We discussed her treatment options. She is our 8 in a brace. She is not really taking any type of pain medication. Prior history of THC use. I explained that while I do not feel comfortable describing narcotic analgesics a long-term basis, I think she would benefit from enough medicine for the next couple weeks to get her over this acute episode. I encouraged her to followup with neurosurgery, if she does not start improving with conservative care she may need vertebroplasty. Continue TLSO Reassured patient that this was not a type of fracture that would cause paraplegia

## 2013-05-19 ENCOUNTER — Other Ambulatory Visit: Payer: Self-pay | Admitting: Neurosurgery

## 2013-05-19 DIAGNOSIS — IMO0002 Reserved for concepts with insufficient information to code with codable children: Secondary | ICD-10-CM | POA: Diagnosis not present

## 2013-05-19 DIAGNOSIS — M545 Low back pain: Secondary | ICD-10-CM | POA: Diagnosis not present

## 2013-05-19 DIAGNOSIS — Z7901 Long term (current) use of anticoagulants: Secondary | ICD-10-CM | POA: Diagnosis not present

## 2013-05-20 ENCOUNTER — Other Ambulatory Visit: Payer: Self-pay | Admitting: Neurosurgery

## 2013-05-20 ENCOUNTER — Ambulatory Visit
Admission: RE | Admit: 2013-05-20 | Discharge: 2013-05-20 | Disposition: A | Discharge status: Home / Self Care | Payer: Medicare Other | Source: Ambulatory Visit | Attending: Neurosurgery | Admitting: Neurosurgery

## 2013-05-20 ENCOUNTER — Other Ambulatory Visit: Payer: Medicare Other

## 2013-05-20 DIAGNOSIS — M545 Low back pain: Secondary | ICD-10-CM

## 2013-05-20 DIAGNOSIS — S32009A Unspecified fracture of unspecified lumbar vertebra, initial encounter for closed fracture: Secondary | ICD-10-CM | POA: Diagnosis not present

## 2013-05-27 DIAGNOSIS — Z7901 Long term (current) use of anticoagulants: Secondary | ICD-10-CM | POA: Diagnosis not present

## 2013-05-30 ENCOUNTER — Other Ambulatory Visit (HOSPITAL_COMMUNITY): Payer: Self-pay | Admitting: Neurosurgery

## 2013-05-30 DIAGNOSIS — M545 Low back pain: Secondary | ICD-10-CM

## 2013-05-30 DIAGNOSIS — T148XXA Other injury of unspecified body region, initial encounter: Secondary | ICD-10-CM

## 2013-06-06 ENCOUNTER — Other Ambulatory Visit: Payer: Self-pay | Admitting: Neurosurgery

## 2013-06-06 ENCOUNTER — Ambulatory Visit (HOSPITAL_COMMUNITY)
Admission: RE | Admit: 2013-06-06 | Discharge: 2013-06-06 | Disposition: A | Discharge status: Home / Self Care | Payer: Medicare Other | Source: Ambulatory Visit | Attending: Neurosurgery | Admitting: Neurosurgery

## 2013-06-06 DIAGNOSIS — M79609 Pain in unspecified limb: Secondary | ICD-10-CM | POA: Diagnosis not present

## 2013-06-06 DIAGNOSIS — G589 Mononeuropathy, unspecified: Secondary | ICD-10-CM | POA: Insufficient documentation

## 2013-06-06 DIAGNOSIS — M545 Low back pain: Secondary | ICD-10-CM

## 2013-06-06 DIAGNOSIS — M8448XA Pathological fracture, other site, initial encounter for fracture: Secondary | ICD-10-CM | POA: Diagnosis not present

## 2013-06-06 DIAGNOSIS — IMO0002 Reserved for concepts with insufficient information to code with codable children: Secondary | ICD-10-CM

## 2013-06-06 DIAGNOSIS — X58XXXA Exposure to other specified factors, initial encounter: Secondary | ICD-10-CM | POA: Insufficient documentation

## 2013-06-06 DIAGNOSIS — S32009A Unspecified fracture of unspecified lumbar vertebra, initial encounter for closed fracture: Secondary | ICD-10-CM | POA: Diagnosis not present

## 2013-06-06 DIAGNOSIS — M519 Unspecified thoracic, thoracolumbar and lumbosacral intervertebral disc disorder: Secondary | ICD-10-CM | POA: Insufficient documentation

## 2013-06-11 DIAGNOSIS — I10 Essential (primary) hypertension: Secondary | ICD-10-CM | POA: Diagnosis not present

## 2013-06-11 DIAGNOSIS — E7800 Pure hypercholesterolemia, unspecified: Secondary | ICD-10-CM | POA: Diagnosis not present

## 2013-06-11 DIAGNOSIS — Z7901 Long term (current) use of anticoagulants: Secondary | ICD-10-CM | POA: Diagnosis not present

## 2013-06-11 DIAGNOSIS — F411 Generalized anxiety disorder: Secondary | ICD-10-CM | POA: Diagnosis not present

## 2013-06-11 DIAGNOSIS — M353 Polymyalgia rheumatica: Secondary | ICD-10-CM | POA: Diagnosis not present

## 2013-06-11 DIAGNOSIS — G894 Chronic pain syndrome: Secondary | ICD-10-CM | POA: Diagnosis not present

## 2013-06-13 ENCOUNTER — Other Ambulatory Visit (HOSPITAL_COMMUNITY): Payer: Self-pay | Admitting: Radiology

## 2013-06-13 ENCOUNTER — Ambulatory Visit
Admission: RE | Admit: 2013-06-13 | Discharge: 2013-06-13 | Disposition: A | Discharge status: Home / Self Care | Payer: Medicare Other | Source: Ambulatory Visit | Attending: Neurosurgery | Admitting: Neurosurgery

## 2013-06-13 ENCOUNTER — Other Ambulatory Visit: Payer: Self-pay | Admitting: Neurosurgery

## 2013-06-13 DIAGNOSIS — IMO0002 Reserved for concepts with insufficient information to code with codable children: Secondary | ICD-10-CM

## 2013-06-13 DIAGNOSIS — M8448XA Pathological fracture, other site, initial encounter for fracture: Secondary | ICD-10-CM | POA: Diagnosis not present

## 2013-06-13 LAB — CBC
HCT: 40.9 % (ref 36.0–46.0)
Hemoglobin: 13.2 g/dL (ref 12.0–15.0)
RDW: 14.1 % (ref 11.5–15.5)
WBC: 7.5 10*3/uL (ref 4.0–10.5)

## 2013-06-17 ENCOUNTER — Inpatient Hospital Stay
Admission: RE | Admit: 2013-06-17 | Discharge: 2013-06-17 | Disposition: A | Discharge status: Home / Self Care | Payer: Medicare Other | Source: Ambulatory Visit | Attending: Neurosurgery | Admitting: Neurosurgery

## 2013-06-17 DIAGNOSIS — S32000A Wedge compression fracture of unspecified lumbar vertebra, initial encounter for closed fracture: Secondary | ICD-10-CM

## 2013-06-17 DIAGNOSIS — Z7901 Long term (current) use of anticoagulants: Secondary | ICD-10-CM | POA: Diagnosis not present

## 2013-06-17 MED ORDER — MIDAZOLAM HCL 2 MG/2ML IJ SOLN: 1.0000 mg | INTRAMUSCULAR | Status: DC | PRN

## 2013-06-17 MED ORDER — KETOROLAC TROMETHAMINE 30 MG/ML IJ SOLN: 30.0000 mg | Freq: Once | INTRAMUSCULAR | Status: DC

## 2013-06-17 MED ORDER — SODIUM CHLORIDE 0.9 % IV SOLN: Freq: Once | INTRAVENOUS | Status: DC

## 2013-06-17 MED ORDER — CEFAZOLIN SODIUM-DEXTROSE 2-3 GM-% IV SOLR: 2.0000 g | Freq: Once | INTRAVENOUS | Status: DC

## 2013-06-17 MED ORDER — FENTANYL CITRATE 0.05 MG/ML IJ SOLN: 25.0000 ug | INTRAMUSCULAR | Status: DC | PRN

## 2013-06-17 NOTE — Progress Notes (Signed)
Pt's INR was 1.9 and she was rescheduled for Friday for her VP.

## 2013-06-20 ENCOUNTER — Ambulatory Visit
Admission: RE | Admit: 2013-06-20 | Discharge: 2013-06-20 | Disposition: A | Discharge status: Home / Self Care | Payer: Medicare Other | Source: Ambulatory Visit | Attending: Neurosurgery | Admitting: Neurosurgery

## 2013-06-20 VITALS — BP 129/74 | HR 81 | Temp 97.7°F | Resp 12

## 2013-06-20 DIAGNOSIS — IMO0002 Reserved for concepts with insufficient information to code with codable children: Secondary | ICD-10-CM

## 2013-06-20 DIAGNOSIS — Z7901 Long term (current) use of anticoagulants: Secondary | ICD-10-CM | POA: Diagnosis not present

## 2013-06-20 DIAGNOSIS — S32000A Wedge compression fracture of unspecified lumbar vertebra, initial encounter for closed fracture: Secondary | ICD-10-CM

## 2013-06-20 DIAGNOSIS — M8448XA Pathological fracture, other site, initial encounter for fracture: Secondary | ICD-10-CM | POA: Diagnosis not present

## 2013-06-20 MED ORDER — FENTANYL CITRATE 0.05 MG/ML IJ SOLN
25.0000 ug | INTRAMUSCULAR | Status: DC | PRN
  Administered 2013-06-20 (×3): 50 ug via INTRAVENOUS
  Administered 2013-06-20: 25 ug via INTRAVENOUS

## 2013-06-20 MED ORDER — CEFAZOLIN SODIUM 1-5 GM-% IV SOLN
1.0000 g | Freq: Three times a day (TID) | INTRAVENOUS | Status: DC
  Administered 2013-06-20: 2 g via INTRAVENOUS

## 2013-06-20 MED ORDER — SODIUM CHLORIDE 0.9 % IV SOLN
Freq: Once | INTRAVENOUS | Status: AC
  Administered 2013-06-20: 09:00:00 via INTRAVENOUS

## 2013-06-20 MED ORDER — KETOROLAC TROMETHAMINE 30 MG/ML IJ SOLN
30.0000 mg | Freq: Once | INTRAMUSCULAR | Status: AC
  Administered 2013-06-20: 30 mg via INTRAVENOUS

## 2013-06-20 MED ORDER — MIDAZOLAM HCL 2 MG/2ML IJ SOLN
1.0000 mg | INTRAMUSCULAR | Status: DC | PRN
  Administered 2013-06-20: 1 mg via INTRAVENOUS
  Administered 2013-06-20: 0.5 mg via INTRAVENOUS
  Administered 2013-06-20 (×2): 1 mg via INTRAVENOUS

## 2013-06-20 NOTE — Progress Notes (Signed)
HOB elevated 30 degrees.  Patient tolerating snack.  Denies pain at present.  jkl

## 2013-06-20 NOTE — Progress Notes (Signed)
Discharge instructions explained to pt. Lungs clear bilaterally and heart sounds are regular. 

## 2013-06-26 DIAGNOSIS — Z23 Encounter for immunization: Secondary | ICD-10-CM | POA: Diagnosis not present

## 2013-06-26 DIAGNOSIS — G8929 Other chronic pain: Secondary | ICD-10-CM | POA: Diagnosis not present

## 2013-06-26 DIAGNOSIS — M353 Polymyalgia rheumatica: Secondary | ICD-10-CM | POA: Diagnosis not present

## 2013-06-26 DIAGNOSIS — Z7901 Long term (current) use of anticoagulants: Secondary | ICD-10-CM | POA: Diagnosis not present

## 2013-06-26 DIAGNOSIS — E7800 Pure hypercholesterolemia, unspecified: Secondary | ICD-10-CM | POA: Diagnosis not present

## 2013-06-26 DIAGNOSIS — I10 Essential (primary) hypertension: Secondary | ICD-10-CM | POA: Diagnosis not present

## 2013-07-01 ENCOUNTER — Encounter: Payer: Medicare Other | Attending: Physical Medicine & Rehabilitation

## 2013-07-01 ENCOUNTER — Ambulatory Visit (HOSPITAL_BASED_OUTPATIENT_CLINIC_OR_DEPARTMENT_OTHER): Payer: Medicare Other | Admitting: Physical Medicine & Rehabilitation

## 2013-07-01 ENCOUNTER — Encounter: Payer: Self-pay | Admitting: Physical Medicine & Rehabilitation

## 2013-07-01 VITALS — BP 122/78 | HR 84 | Resp 14 | Ht 64.0 in | Wt 153.0 lb

## 2013-07-01 DIAGNOSIS — S336XXD Sprain of sacroiliac joint, subsequent encounter: Secondary | ICD-10-CM

## 2013-07-01 DIAGNOSIS — S32009A Unspecified fracture of unspecified lumbar vertebra, initial encounter for closed fracture: Secondary | ICD-10-CM | POA: Insufficient documentation

## 2013-07-01 DIAGNOSIS — IMO0002 Reserved for concepts with insufficient information to code with codable children: Secondary | ICD-10-CM | POA: Diagnosis not present

## 2013-07-01 DIAGNOSIS — X58XXXA Exposure to other specified factors, initial encounter: Secondary | ICD-10-CM | POA: Insufficient documentation

## 2013-07-01 DIAGNOSIS — S32000D Wedge compression fracture of unspecified lumbar vertebra, subsequent encounter for fracture with routine healing: Secondary | ICD-10-CM

## 2013-07-01 DIAGNOSIS — Z5189 Encounter for other specified aftercare: Secondary | ICD-10-CM | POA: Diagnosis not present

## 2013-07-01 NOTE — Progress Notes (Signed)
Subjective:    Patient ID: Elizabeth [name], female    DOB: [birth year], 72 y.o.   MRN: [id number]  HPI L5 End plate fracture vertebroplasty 9/26 C/o muscle spasm. Only taking Flexeril once a day rather than the prescribed 3 times per day Not using lumbar brace anymore since the vertebroplasty. We discussed that this still may be helpful  Has clicking sensation in the low back area. No numbness in the legs no bowel or bladder problems no leg weakness.  new primary physician Dr. Shirlean Mylar will prescribe oxycodone. Pain Inventory Average Pain 9 Pain Right Now 9 My pain is sharp, dull and stabbing  In the last 24 hours, has pain interfered with the following? General activity 9 Relation with others 8 Enjoyment of life 9 What TIME of day is your pain at its worst? all day Sleep (in general) Fair  Pain is worse with: walking, bending, sitting and standing Pain improves with: heat/ice and medication Relief from Meds: 7  Mobility walk without assistance ability to climb steps?  yes do you drive?  no  Function retired  Neuro/Psych trouble walking  Prior Studies Any changes since last visit?  no  Physicians involved in your care Any changes since last visit?  no   Family History  Problem Relation Age of Onset  . Cancer Daughter    History   Social History  . Marital Status: Divorced    Spouse Name: N/A    Number of Children: N/A  . Years of Education: N/A   Social History Main Topics  . Smoking status: Never Smoker   . Smokeless tobacco: Never Used  . Alcohol Use: No  . Drug Use: No  . Sexual Activity: None   Other Topics Concern  . None   Social History Narrative  . None   Past Surgical History  Procedure Laterality Date  . Cholecystectomy     Past Medical History  Diagnosis Date  . Pulmonary embolus   . Lyme disease   . Blood clot in vein   . RSD (reflex sympathetic dystrophy)   . Fibromyalgia   . PMR (polymyalgia rheumatica)   . Thyroid  disease    BP 122/78  Pulse 84  Resp 14  Ht 5\' 4"  (1.626 m)  Wt 153 lb (69.4 kg)  BMI 26.25 kg/m2  SpO2 98%      Review of Systems  Musculoskeletal: Positive for gait problem.  All other systems reviewed and are negative.       Objective:   Physical Exam  Nursing note and vitals reviewed. Constitutional: She is oriented to person, place, and time. She appears well-developed and well-nourished.  HENT:  Head: Normocephalic.  Eyes: EOM are normal. Pupils are equal, round, and reactive to light.  Neurological: She is alert and oriented to person, place, and time. She has normal strength and normal reflexes. No sensory deficit. Gait normal.  Psychiatric: She has a normal mood and affect.          Assessment & Plan:  1. L5 compression fracture still healing after vertebral plasty , no evidence of infection The sacroiliac joint may also be aggravated and causing increased pain. If not better in the 1-2 months I would repeat the sacroiliac injection.  Patient has been more active after the vertebroplasty and has not used the lumbar orthosis. She has been bending over and cleaning. I advised her to wear the brace once again and not do housework For another 4-6 weeks. Family physician will prescribe  oxycodone Advised using lumbar brace Advised using Flexeril one half or 1 tablet 3 times per day

## 2013-07-01 NOTE — Patient Instructions (Signed)
Family physician will prescribe oxycodone Advised using lumbar brace Advised using Flexeril one half or 1 tablet 3 times per day

## 2013-07-09 DIAGNOSIS — R300 Dysuria: Secondary | ICD-10-CM | POA: Diagnosis not present

## 2013-07-09 DIAGNOSIS — E785 Hyperlipidemia, unspecified: Secondary | ICD-10-CM | POA: Diagnosis not present

## 2013-07-10 ENCOUNTER — Ambulatory Visit: Payer: Medicare Other | Admitting: Physical Medicine & Rehabilitation

## 2013-08-18 ENCOUNTER — Telehealth: Payer: Self-pay

## 2013-08-18 NOTE — Telephone Encounter (Signed)
Patient was requesting a appt. Front office contacted patient to set up appt.

## 2013-08-20 ENCOUNTER — Encounter: Payer: Medicare Other | Attending: Physical Medicine & Rehabilitation | Admitting: Physical Medicine and Rehabilitation

## 2013-08-20 ENCOUNTER — Telehealth: Payer: Self-pay | Admitting: Physical Medicine & Rehabilitation

## 2013-08-20 ENCOUNTER — Encounter: Payer: Self-pay | Admitting: Physical Medicine and Rehabilitation

## 2013-08-20 VITALS — BP 141/84 | HR 85 | Resp 14 | Ht 64.0 in | Wt 151.2 lb

## 2013-08-20 DIAGNOSIS — Z8781 Personal history of (healed) traumatic fracture: Secondary | ICD-10-CM

## 2013-08-20 DIAGNOSIS — S32009A Unspecified fracture of unspecified lumbar vertebra, initial encounter for closed fracture: Secondary | ICD-10-CM | POA: Diagnosis not present

## 2013-08-20 DIAGNOSIS — X58XXXA Exposure to other specified factors, initial encounter: Secondary | ICD-10-CM | POA: Insufficient documentation

## 2013-08-20 DIAGNOSIS — M533 Sacrococcygeal disorders, not elsewhere classified: Secondary | ICD-10-CM

## 2013-08-20 NOTE — Telephone Encounter (Signed)
Left message for patient to call office regarding SI injection.  Advised her on the message to stop coumadin on thanksgiving.  Asked patient to call office to confirm.  She would also need to have PT/INR checked 24hrs before injection.

## 2013-08-20 NOTE — Progress Notes (Signed)
Subjective:    Patient ID: Elizabeth [name], female    DOB: [birth year], 72 y.o.   MRN: [id number]  HPI L5 End plate fracture vertebroplasty 9/26  C/o muscle spasm. Only taking Flexeril once a day rather than the prescribed 3 times per day  She states that she is wearing the lumbar brace, but does not wear it today. She had a  vertebroplasty done on September 26, she still has a lot of LBP . She also complains about pain in her right SI joint.  No numbness in the legs no bowel or bladder problems no leg weakness.  new primary physician Dr. Shirlean Mylar will prescribe oxycodone.  Pain Inventory Average Pain 8 Pain Right Now 7 My pain is burning, dull and aching  In the last 24 hours, has pain interfered with the following? General activity 9 Relation with others 9 Enjoyment of life 9 What TIME of day is your pain at its worst? morning, daytime, evening Sleep (in general) Fair  Pain is worse with: bending, sitting, inactivity and standing Pain improves with: rest, heat/ice, pacing activities, medication and injections Relief from Meds: 7  Mobility walk without assistance ability to climb steps?  yes do you drive?  yes  Function retired  Neuro/Psych spasms depression anxiety  Prior Studies Any changes since last visit?  yes x-rays CT/MRI  Physicians involved in your care Any changes since last visit?  no   Family History  Problem Relation Age of Onset  . Cancer Daughter    History   Social History  . Marital Status: Divorced    Spouse Name: N/A    Number of Children: N/A  . Years of Education: N/A   Social History Main Topics  . Smoking status: Never Smoker   . Smokeless tobacco: Never Used  . Alcohol Use: No  . Drug Use: No  . Sexual Activity: None   Other Topics Concern  . None   Social History Narrative  . None   Past Surgical History  Procedure Laterality Date  . Cholecystectomy     Past Medical History  Diagnosis Date  . Pulmonary  embolus   . Lyme disease   . Blood clot in vein   . RSD (reflex sympathetic dystrophy)   . Fibromyalgia   . PMR (polymyalgia rheumatica)   . Thyroid disease    BP 141/84  Pulse 85  Resp 14  Ht 5\' 4"  (1.626 m)  Wt 151 lb 3.2 oz (68.584 kg)  BMI 25.94 kg/m2  SpO2 97%      Review of Systems  Musculoskeletal: Positive for back pain.  Neurological:       Spasms  Psychiatric/Behavioral: Positive for dysphoric mood. The patient is nervous/anxious.   All other systems reviewed and are negative.       Objective:   Physical Exam Constitutional: She is oriented to person, place, and time. She appears well-developed and well-nourished.  HENT:  Head: Normocephalic.  Eyes: EOM are normal. Pupils are equal, round, and reactive to light.  Neurological: She is alert and oriented to person, place, and time. She has normal strength and normal reflexes. No sensory deficit. Gait normal. Pain on palpation and pressure on right SI joint Psychiatric: She has a normal mood and affect.        Assessment & Plan:  1. L5 compression fracture still healing after vertebral plasty , no evidence of infection  2. Pain in right SI joint, will order right SI injection, Dr. Doroteo Bradford only has  another appointment available in January, will talk to him and try to get her in earlier.  Dr. Doroteo Bradford put in his last note that he might do the sacroiliac injection.  Patient has  States that she wears the brace, and is less active, but still is in pain.  Family physician will prescribe oxycodone  Advised using lumbar brace  Continue using Flexeril one half or 1 tablet 3 times per day

## 2013-08-20 NOTE — Patient Instructions (Signed)
You can try Arnica cream for your SI pain on the right

## 2013-08-20 NOTE — Telephone Encounter (Signed)
Pt  States she is on Coumadin and would like to have a SI injection in about three weeks. Pt is aware that she will need to be off the coumadin five days prior to procedure, but she is concern about the appointment time... Advise pt a note will be place in the system... Ad

## 2013-08-25 NOTE — Telephone Encounter (Signed)
Per patient she stopped her coumadin and has appointment for blood work.  She was informed to be at the office at 10:45am

## 2013-08-26 ENCOUNTER — Ambulatory Visit: Payer: Medicare Other | Admitting: Physical Medicine & Rehabilitation

## 2013-08-26 ENCOUNTER — Encounter: Payer: Medicare Other | Attending: Physical Medicine & Rehabilitation

## 2013-08-26 ENCOUNTER — Encounter: Payer: Self-pay | Admitting: Physical Medicine & Rehabilitation

## 2013-08-26 VITALS — BP 139/85 | HR 86 | Resp 14 | Ht 64.0 in | Wt 151.0 lb

## 2013-08-26 DIAGNOSIS — M533 Sacrococcygeal disorders, not elsewhere classified: Secondary | ICD-10-CM | POA: Insufficient documentation

## 2013-08-26 DIAGNOSIS — G894 Chronic pain syndrome: Secondary | ICD-10-CM | POA: Insufficient documentation

## 2013-08-26 DIAGNOSIS — M47817 Spondylosis without myelopathy or radiculopathy, lumbosacral region: Secondary | ICD-10-CM | POA: Diagnosis not present

## 2013-08-26 NOTE — Progress Notes (Signed)
  PROCEDURE RECORD [facility]   Name: Stacee Earp DOB:07/28/1941 MRN: [id number]  Date:08/26/2013  Physician: Claudette Laws, MD    Nurse/CMA: Kelli Churn, CMA  Allergies:  Allergies  Allergen Reactions  . Ivp Dye [Iodinated Diagnostic Agents] Anaphylaxis  . Codeine Palpitations    Consent Signed: yes  Is patient diabetic? no    Pregnant: no LMP: No LMP recorded. Patient is postmenopausal. (age 71-55)  Anticoagulants: yes (stopped coumadin 11/27 INR 1.1 today) Anti-inflammatory: no Antibiotics: no  Procedure: bilateral Sacroiliac injection  Position: Prone Start Time: 1141  End Time: 1148  Fluoro Time: 17  RN/CMA Levens, CMA Levens, CMA    Time 1056 1152    BP 139/85 144/70    Pulse 86 82    Respirations 14 14    O2 Sat 95 99    S/S 6 6    Pain Level 7/10 0/10     D/C home with Sister, patient A & O X 3, D/C instructions reviewed, and sits independently.

## 2013-08-26 NOTE — Progress Notes (Signed)
Bilateral sacroiliac injections under fluoroscopic guidance  Indication: Low back and buttocks pain not relieved by medication management and other conservative care.  Informed consent was obtained after describing risks and benefits of the procedure with the patient, this includes bleeding, bruising, infection, paralysis and medication side effects. The patient wishes to proceed and has given written consent. The patient was placed in a prone position. The lumbar and sacral area was marked and prepped with Betadine. A 25-gauge 1-1/2 inch needle was inserted into the skin and subcutaneous tissue and 1 mL of 1% lidocaine was injected into each side. Then a 25-gauge 3 inch spinal needle was inserted under fluoroscopic guidance into the left sacroiliac joint. AP and lateral images were utilized. Omnipaque 180x0.5 mL under live fluoroscopy demonstrated no intravascular uptake. Then a solution containing one ML of 6 mg per mL celestone 2 ML of 2% lidocaine MPF was injected x1.5 mL. This same procedure was repeated on the right side using the same needle, injectate, and technique. Patient tolerated the procedure well. Post procedure instructions were given. Please see post procedure form. 

## 2013-09-22 DIAGNOSIS — G8929 Other chronic pain: Secondary | ICD-10-CM | POA: Diagnosis not present

## 2013-09-22 DIAGNOSIS — R3989 Other symptoms and signs involving the genitourinary system: Secondary | ICD-10-CM | POA: Diagnosis not present

## 2013-09-22 DIAGNOSIS — E039 Hypothyroidism, unspecified: Secondary | ICD-10-CM | POA: Diagnosis not present

## 2013-09-22 DIAGNOSIS — N390 Urinary tract infection, site not specified: Secondary | ICD-10-CM | POA: Diagnosis not present

## 2013-09-22 DIAGNOSIS — Z7901 Long term (current) use of anticoagulants: Secondary | ICD-10-CM | POA: Diagnosis not present

## 2013-10-02 ENCOUNTER — Ambulatory Visit (HOSPITAL_BASED_OUTPATIENT_CLINIC_OR_DEPARTMENT_OTHER): Payer: Medicare Other | Admitting: Physical Medicine & Rehabilitation

## 2013-10-02 ENCOUNTER — Encounter: Payer: Self-pay | Admitting: Physical Medicine & Rehabilitation

## 2013-10-02 ENCOUNTER — Encounter: Payer: Medicare Other | Attending: Physical Medicine & Rehabilitation

## 2013-10-02 VITALS — BP 155/87 | HR 95 | Resp 14 | Ht 64.0 in | Wt 149.2 lb

## 2013-10-02 DIAGNOSIS — X58XXXA Exposure to other specified factors, initial encounter: Secondary | ICD-10-CM | POA: Insufficient documentation

## 2013-10-02 DIAGNOSIS — M25519 Pain in unspecified shoulder: Secondary | ICD-10-CM

## 2013-10-02 DIAGNOSIS — S32009A Unspecified fracture of unspecified lumbar vertebra, initial encounter for closed fracture: Secondary | ICD-10-CM | POA: Diagnosis not present

## 2013-10-02 DIAGNOSIS — E039 Hypothyroidism, unspecified: Secondary | ICD-10-CM | POA: Diagnosis not present

## 2013-10-02 DIAGNOSIS — Z7901 Long term (current) use of anticoagulants: Secondary | ICD-10-CM | POA: Diagnosis not present

## 2013-10-02 DIAGNOSIS — M25512 Pain in left shoulder: Secondary | ICD-10-CM

## 2013-10-02 DIAGNOSIS — I82409 Acute embolism and thrombosis of unspecified deep veins of unspecified lower extremity: Secondary | ICD-10-CM | POA: Diagnosis not present

## 2013-10-02 NOTE — Patient Instructions (Signed)
Ultrasound of the left shoulder showed abnormalities at the left biceps tendon. I suspect a biceps tendon tear and will need an MRI to further evaluate

## 2013-10-02 NOTE — Progress Notes (Signed)
Left shoulder diagnostic ultrasound Indication left shoulder pain after fall Patient placed seated on exam table. 12 Hz linear transducer used  Short axis view left bicipital groove demonstrates irregular cross section of the biceps tendon. No evidence of subluxation outside of the groove with external rotation Long axis view left bicipital groove demonstrates a large anechoic adjacent to the biceps tendon with fluid signal Long axis view of the left subscapularis showed no evidence of tear tendon thickness 5.2 mm Short axis view left subscapularis tendon showed no evidence of tear thickness 4.5 mm Long axis view left supraspinatus tendon showed no evidence of tears tendon thickness 5.6 mm. Subacromial bursa thickness 1 mm Short axis view supraspinatus tendon no evidence of tears, tendon thickness 4.6 mm Long axis view infraspinatus tendon  evidence of partial tear on both articular and bursal surface Short axis view infraspinatus tendon evidence of partial tear  on both articular and bursal surface Long axis view a.c. joint no evidence of capsular disruption. Joint surfaces appears smooth  Impression 1. Abnormal study 2. Evidence of left proximal biceps tendon tear 3. Evidence of partial tear left infraspinatus tendon articular as well as bursal surface

## 2013-10-13 ENCOUNTER — Ambulatory Visit (HOSPITAL_COMMUNITY)
Admission: RE | Admit: 2013-10-13 | Discharge: 2013-10-13 | Disposition: A | Discharge status: Home / Self Care | Payer: Medicare Other | Source: Ambulatory Visit | Attending: Physical Medicine & Rehabilitation | Admitting: Physical Medicine & Rehabilitation

## 2013-10-13 DIAGNOSIS — M25419 Effusion, unspecified shoulder: Secondary | ICD-10-CM | POA: Diagnosis not present

## 2013-10-13 DIAGNOSIS — S46819A Strain of other muscles, fascia and tendons at shoulder and upper arm level, unspecified arm, initial encounter: Secondary | ICD-10-CM | POA: Insufficient documentation

## 2013-10-13 DIAGNOSIS — M25512 Pain in left shoulder: Secondary | ICD-10-CM

## 2013-10-13 DIAGNOSIS — W19XXXA Unspecified fall, initial encounter: Secondary | ICD-10-CM | POA: Insufficient documentation

## 2013-10-16 DIAGNOSIS — IMO0002 Reserved for concepts with insufficient information to code with codable children: Secondary | ICD-10-CM | POA: Diagnosis not present

## 2013-10-16 DIAGNOSIS — I10 Essential (primary) hypertension: Secondary | ICD-10-CM | POA: Diagnosis not present

## 2013-10-17 DIAGNOSIS — E039 Hypothyroidism, unspecified: Secondary | ICD-10-CM | POA: Diagnosis not present

## 2013-10-17 DIAGNOSIS — Z7901 Long term (current) use of anticoagulants: Secondary | ICD-10-CM | POA: Diagnosis not present

## 2013-10-17 DIAGNOSIS — G894 Chronic pain syndrome: Secondary | ICD-10-CM | POA: Diagnosis not present

## 2013-10-21 DIAGNOSIS — M545 Low back pain, unspecified: Secondary | ICD-10-CM | POA: Diagnosis not present

## 2013-10-21 DIAGNOSIS — M5126 Other intervertebral disc displacement, lumbar region: Secondary | ICD-10-CM | POA: Diagnosis not present

## 2013-10-21 DIAGNOSIS — M47817 Spondylosis without myelopathy or radiculopathy, lumbosacral region: Secondary | ICD-10-CM | POA: Diagnosis not present

## 2013-10-21 DIAGNOSIS — IMO0002 Reserved for concepts with insufficient information to code with codable children: Secondary | ICD-10-CM | POA: Diagnosis not present

## 2013-11-03 ENCOUNTER — Ambulatory Visit: Payer: Medicare Other | Admitting: Physical Medicine & Rehabilitation

## 2013-11-07 DIAGNOSIS — Z6829 Body mass index (BMI) 29.0-29.9, adult: Secondary | ICD-10-CM | POA: Diagnosis not present

## 2013-11-07 DIAGNOSIS — IMO0002 Reserved for concepts with insufficient information to code with codable children: Secondary | ICD-10-CM | POA: Diagnosis not present

## 2013-11-07 DIAGNOSIS — I10 Essential (primary) hypertension: Secondary | ICD-10-CM | POA: Diagnosis not present

## 2013-11-10 DIAGNOSIS — I82409 Acute embolism and thrombosis of unspecified deep veins of unspecified lower extremity: Secondary | ICD-10-CM | POA: Diagnosis not present

## 2013-11-10 DIAGNOSIS — M810 Age-related osteoporosis without current pathological fracture: Secondary | ICD-10-CM | POA: Diagnosis not present

## 2013-11-10 DIAGNOSIS — Z7901 Long term (current) use of anticoagulants: Secondary | ICD-10-CM | POA: Diagnosis not present

## 2013-11-24 DIAGNOSIS — M25519 Pain in unspecified shoulder: Secondary | ICD-10-CM | POA: Diagnosis not present

## 2013-11-24 DIAGNOSIS — S43429A Sprain of unspecified rotator cuff capsule, initial encounter: Secondary | ICD-10-CM | POA: Diagnosis not present

## 2013-12-04 ENCOUNTER — Ambulatory Visit: Payer: Medicare Other | Admitting: Physical Medicine & Rehabilitation

## 2013-12-04 DIAGNOSIS — IMO0002 Reserved for concepts with insufficient information to code with codable children: Secondary | ICD-10-CM | POA: Diagnosis not present

## 2013-12-04 DIAGNOSIS — Z79899 Other long term (current) drug therapy: Secondary | ICD-10-CM | POA: Diagnosis not present

## 2013-12-04 DIAGNOSIS — IMO0001 Reserved for inherently not codable concepts without codable children: Secondary | ICD-10-CM | POA: Diagnosis not present

## 2013-12-04 DIAGNOSIS — M353 Polymyalgia rheumatica: Secondary | ICD-10-CM | POA: Diagnosis not present

## 2013-12-04 DIAGNOSIS — Z87448 Personal history of other diseases of urinary system: Secondary | ICD-10-CM | POA: Diagnosis not present

## 2014-01-15 DIAGNOSIS — M353 Polymyalgia rheumatica: Secondary | ICD-10-CM | POA: Diagnosis not present

## 2014-01-15 DIAGNOSIS — G894 Chronic pain syndrome: Secondary | ICD-10-CM | POA: Diagnosis not present

## 2014-01-15 DIAGNOSIS — I10 Essential (primary) hypertension: Secondary | ICD-10-CM | POA: Diagnosis not present

## 2014-01-15 DIAGNOSIS — M810 Age-related osteoporosis without current pathological fracture: Secondary | ICD-10-CM | POA: Diagnosis not present

## 2014-02-18 DIAGNOSIS — G894 Chronic pain syndrome: Secondary | ICD-10-CM | POA: Diagnosis not present

## 2014-02-18 DIAGNOSIS — G9050 Complex regional pain syndrome I, unspecified: Secondary | ICD-10-CM | POA: Diagnosis not present

## 2014-02-18 DIAGNOSIS — M810 Age-related osteoporosis without current pathological fracture: Secondary | ICD-10-CM | POA: Diagnosis not present

## 2014-02-18 DIAGNOSIS — M353 Polymyalgia rheumatica: Secondary | ICD-10-CM | POA: Diagnosis not present

## 2014-04-06 ENCOUNTER — Encounter: Payer: Self-pay | Admitting: Physical Medicine & Rehabilitation

## 2014-04-06 ENCOUNTER — Encounter: Payer: Medicare Other | Attending: Physical Medicine & Rehabilitation

## 2014-04-06 ENCOUNTER — Ambulatory Visit (HOSPITAL_BASED_OUTPATIENT_CLINIC_OR_DEPARTMENT_OTHER): Payer: Medicare Other | Admitting: Physical Medicine & Rehabilitation

## 2014-04-06 VITALS — BP 144/90 | HR 95 | Resp 14 | Ht 64.0 in | Wt 146.0 lb

## 2014-04-06 DIAGNOSIS — M545 Low back pain, unspecified: Secondary | ICD-10-CM | POA: Insufficient documentation

## 2014-04-06 DIAGNOSIS — Z86711 Personal history of pulmonary embolism: Secondary | ICD-10-CM | POA: Insufficient documentation

## 2014-04-06 DIAGNOSIS — IMO0002 Reserved for concepts with insufficient information to code with codable children: Secondary | ICD-10-CM | POA: Diagnosis not present

## 2014-04-06 DIAGNOSIS — IMO0001 Reserved for inherently not codable concepts without codable children: Secondary | ICD-10-CM | POA: Insufficient documentation

## 2014-04-06 DIAGNOSIS — G9050 Complex regional pain syndrome I, unspecified: Secondary | ICD-10-CM | POA: Diagnosis not present

## 2014-04-06 DIAGNOSIS — M810 Age-related osteoporosis without current pathological fracture: Secondary | ICD-10-CM | POA: Diagnosis not present

## 2014-04-06 DIAGNOSIS — M533 Sacrococcygeal disorders, not elsewhere classified: Secondary | ICD-10-CM

## 2014-04-06 DIAGNOSIS — M5416 Radiculopathy, lumbar region: Secondary | ICD-10-CM

## 2014-04-06 MED ORDER — GABAPENTIN 100 MG PO CAPS: 100.0000 mg | ORAL_CAPSULE | Freq: Three times a day (TID) | ORAL | Status: DC

## 2014-04-06 NOTE — Progress Notes (Signed)
   Subjective:    Patient ID: Elizabeth [name], female    DOB: [birth year], 73 y.o.   MRN: [id number]  HPI Sees Rheumatology for osteoporosis Left thigh burning pain Low back pain exacerbation- some radiation to R foot , Left leg Reviewed MRI, no central stenosis, some foraminal stenosis L2 Pain Inventory Average Pain 8 Pain Right Now 8 My pain is aching  In the last 24 hours, has pain interfered with the following? General activity 9 Relation with others 9 Enjoyment of life 9 What TIME of day is your pain at its worst? daytime Sleep (in general) Fair  Pain is worse with: bending, sitting, inactivity and standing Pain improves with: rest, heat/ice and medication Relief from Meds: 6  Mobility how many minutes can you walk? 15 do you drive?  yes  Function retired  Neuro/Psych numbness tingling spasms  Prior Studies bone scan x-rays CT/MRI  Physicians involved in your care Dr Hyman HopesWebb, Dr Jeral FruitBotero   Family History  Problem Relation Age of Onset  . Cancer Daughter    History   Social History  . Marital Status: Widowed    Spouse Name: N/A    Number of Children: N/A  . Years of Education: N/A   Social History Main Topics  . Smoking status: Never Smoker   . Smokeless tobacco: Never Used  . Alcohol Use: No  . Drug Use: No  . Sexual Activity: None   Other Topics Concern  . None   Social History Narrative  . None   Past Surgical History  Procedure Laterality Date  . Cholecystectomy     Past Medical History  Diagnosis Date  . Pulmonary embolus   . Lyme disease   . Blood clot in vein   . RSD (reflex sympathetic dystrophy)   . Fibromyalgia   . PMR (polymyalgia rheumatica)   . Thyroid disease    BP 144/90  Pulse 95  Resp 14  Ht 5\' 4"  (1.626 m)  Wt 146 lb (66.225 kg)  BMI 25.05 kg/m2  SpO2 98%  Opioid Risk Score:   Fall Risk Score: Low Fall Risk (0-5 points) (patient educated handout declined)   Review of Systems  Musculoskeletal: Positive for  back pain.  Neurological: Positive for numbness.  All other systems reviewed and are negative.      Objective:   Physical Exam  Nursing note and vitals reviewed. Constitutional: She is oriented to person, place, and time. She appears well-developed and well-nourished.  HENT:  Head: Normocephalic and atraumatic.  Eyes: Conjunctivae and EOM are normal. Pupils are equal, round, and reactive to light.  Neurological: She is alert and oriented to person, place, and time. She has normal reflexes.  Psychiatric: She has a normal mood and affect.    Sensation intact to pp in BLE L2-S1 DTRs normal BLE Normal strength Tenderness Bilateral PSIS Lumbar ROM 50% flex, 25% ext Gait no foot drop      Assessment & Plan:  1.  Sacroiliac pain with pseudosciatica, good results with previous sacroiliac injections. Will reschedule for about 2-3 weeks 2.  Left L2 radic, this correlates with MRI findings. She has not had any specific treatment for this. We'll trial gabapentin 100 mg 3 times per day starting with night time dose for the first 2 nights. May need to go up to 300 mg if this is not effective. We also discussed L2 selective nerve root block as another treatment option

## 2014-04-24 ENCOUNTER — Ambulatory Visit (HOSPITAL_BASED_OUTPATIENT_CLINIC_OR_DEPARTMENT_OTHER): Payer: Medicare Other | Admitting: Physical Medicine & Rehabilitation

## 2014-04-24 ENCOUNTER — Encounter: Payer: Self-pay | Admitting: Physical Medicine & Rehabilitation

## 2014-04-24 VITALS — BP 123/77 | HR 87 | Resp 14 | Ht 64.0 in | Wt 146.0 lb

## 2014-04-24 DIAGNOSIS — M545 Low back pain, unspecified: Secondary | ICD-10-CM | POA: Diagnosis not present

## 2014-04-24 DIAGNOSIS — M533 Sacrococcygeal disorders, not elsewhere classified: Secondary | ICD-10-CM

## 2014-04-24 MED ORDER — GABAPENTIN 100 MG PO CAPS: 100.0000 mg | ORAL_CAPSULE | Freq: Three times a day (TID) | ORAL | Status: DC

## 2014-04-24 NOTE — Patient Instructions (Signed)
Repeat Sacroiliac injection prior to your cruise

## 2014-04-24 NOTE — Progress Notes (Signed)
Bilateral sacroiliac injections under fluoroscopic guidance  Indication: Low back and buttocks pain not relieved by medication management and other conservative care.  Informed consent was obtained after describing risks and benefits of the procedure with the patient, this includes bleeding, bruising, infection, paralysis and medication side effects. The patient wishes to proceed and has given written consent. The patient was placed in a prone position. The lumbar and sacral area was marked and prepped with Betadine. A 25-gauge 1-1/2 inch needle was inserted into the skin and subcutaneous tissue and 1 mL of 1% lidocaine was injected into each side. Then a 25-gauge 3 inch spinal needle was inserted under fluoroscopic guidance into the left sacroiliac joint. AP and lateral images were utilized. Omnipaque 180x0.5 mL under live fluoroscopy demonstrated no intravascular uptake. Then a solution containing one ML of 6 mg per mL celestone 2 ML of 2% lidocaine MPF was injected x1.5 mL. This same procedure was repeated on the right side using the same needle, injectate, and technique. Patient tolerated the procedure well. Post procedure instructions were given. Please see post procedure form.

## 2014-04-24 NOTE — Progress Notes (Signed)
  PROCEDURE RECORD [facility]   Name: Barrett HenleFrances Fasnacht DOB:Apr 11, 1941 MRN: [id number]  Date:04/24/2014  Physician: Claudette LawsAndrew Kirsteins, MD    Nurse/CMA: Anahy Esh,CMA/Shumaker, RN  Allergies:  Allergies  Allergen Reactions  . Ivp Dye [Iodinated Diagnostic Agents] Anaphylaxis  . Codeine Palpitations    Consent Signed: Yes.    Is patient diabetic? No.   Pregnant: No. LMP: No LMP recorded. Patient is postmenopausal. (age 72-55)  Anticoagulants: no Anti-inflammatory: no Antibiotics: no  Procedure: Sacroiliac Injection  Position: Prone Start Time:  9:13 End Time: 9:17  Fluoro Time: 13 sec  RN/CMA Yonathan Perrow,CMA Shumaker,RN    Time 852 9:20    BP 123/77 125/72    Pulse 87 90    Respirations 14 14    O2 Sat 98 98    S/S 6 6    Pain Level 8/10 7/10     D/C home with sister, patient A & O X 3, D/C instructions reviewed, and sits independently.

## 2014-05-04 DIAGNOSIS — Z87448 Personal history of other diseases of urinary system: Secondary | ICD-10-CM | POA: Diagnosis not present

## 2014-05-04 DIAGNOSIS — M353 Polymyalgia rheumatica: Secondary | ICD-10-CM | POA: Diagnosis not present

## 2014-05-04 DIAGNOSIS — IMO0001 Reserved for inherently not codable concepts without codable children: Secondary | ICD-10-CM | POA: Diagnosis not present

## 2014-05-04 DIAGNOSIS — M810 Age-related osteoporosis without current pathological fracture: Secondary | ICD-10-CM | POA: Diagnosis not present

## 2014-05-04 DIAGNOSIS — Z79899 Other long term (current) drug therapy: Secondary | ICD-10-CM | POA: Diagnosis not present

## 2014-05-12 DIAGNOSIS — G894 Chronic pain syndrome: Secondary | ICD-10-CM | POA: Diagnosis not present

## 2014-05-21 DIAGNOSIS — E039 Hypothyroidism, unspecified: Secondary | ICD-10-CM | POA: Diagnosis not present

## 2014-05-25 DIAGNOSIS — J40 Bronchitis, not specified as acute or chronic: Secondary | ICD-10-CM | POA: Diagnosis not present

## 2014-05-26 ENCOUNTER — Ambulatory Visit: Payer: Medicare Other | Admitting: Physical Medicine & Rehabilitation

## 2014-06-09 DIAGNOSIS — Z23 Encounter for immunization: Secondary | ICD-10-CM | POA: Diagnosis not present

## 2014-07-23 ENCOUNTER — Ambulatory Visit (HOSPITAL_BASED_OUTPATIENT_CLINIC_OR_DEPARTMENT_OTHER): Payer: Medicare Other | Admitting: Physical Medicine & Rehabilitation

## 2014-07-23 ENCOUNTER — Encounter: Payer: Self-pay | Admitting: Physical Medicine & Rehabilitation

## 2014-07-23 ENCOUNTER — Encounter: Payer: Medicare Other | Attending: Physical Medicine & Rehabilitation

## 2014-07-23 VITALS — BP 124/77 | HR 78 | Resp 14 | Ht 65.0 in | Wt 154.0 lb

## 2014-07-23 DIAGNOSIS — M533 Sacrococcygeal disorders, not elsewhere classified: Secondary | ICD-10-CM | POA: Diagnosis not present

## 2014-07-23 DIAGNOSIS — M47817 Spondylosis without myelopathy or radiculopathy, lumbosacral region: Secondary | ICD-10-CM

## 2014-07-23 NOTE — Progress Notes (Signed)
  PROCEDURE RECORD [facility]   Name: Elizabeth [name] DOB:1941-03-20 MRN: [id number]  Date:07/23/2014  Physician: Claudette LawsAndrew Kirsteins, MD    Nurse/CMA: Douglass Dunshee   Allergies:  Allergies  Allergen Reactions  . Ivp Dye [Iodinated Diagnostic Agents] Anaphylaxis  . Codeine Palpitations    Consent Signed: Yes.    Is patient diabetic? No.  CBG today? .   Pregnant: No. LMP: No LMP recorded. Patient is postmenopausal. (age 72-55)  Anticoagulants: no Anti-inflammatory: no Antibiotics: no  Procedure: right  Medial branch blocks  Position: Prone Start Time 12:31` End Time:   Fluoro Time:   RN/CMA Brion Hedges Ginekl Aeliana Spates    Time 12:22 pm 12:35    BP 124/78 160/82    Pulse 78 84    Respirations 14 14    O2 Sat 99 98    S/S 6/6 6/6    Pain Level 7.5/10 2/10     D/C home with sister in law, patient A & O X 3, D/C instructions reviewed, and sits independently.

## 2014-07-23 NOTE — Progress Notes (Signed)
Right lumbar L3, L4 medial branch blocks and L5 dorsal ramus injection under fluoroscopic guidance  Indication: Right Lumbar pain which is not relieved by medication management or other conservative care and interfering with self-care and mobility.  Informed consent was obtained after describing risks and benefits of the procedure with the patient, this includes bleeding, bruising, infection, paralysis and medication side effects. The patient wishes to proceed and has given written consent. The patient was placed in a prone position. The lumbar area was marked and prepped with Betadine. One ML of 1% lidocaine was injected into each of 3 areas into the skin and subcutaneous tissue. Then a 22-gauge 3.5 inch spinal needle was inserted targeting the junction of the Right S1 superior articular process and sacral ala junction. Needle was advanced under fluoroscopic guidance. Bone contact was made. Omnipaque 180 was injected x0.5 mL demonstrating no intravascular uptake. Then a solution containing one ML of 4 mg per mL dexamethasone and 3 mL of 2% MPF lidocaine was injected x0.5 mL. Then the Right L5 superior articular process in transverse process junction was targeted. Bone contact was made. Omnipaque 180 was injected x0.5 mL demonstrating no intravascular uptake. Then a solution containing one ML of 4 mg per mL dexamethasone and 3 mL of 2% MPF lidocaine was injected x0.5 mL. Then the Right L4 superior articular process in transverse process junction was targeted. Bone contact was made. Omnipaque 180 was injected x0.5 mL demonstrating no intravascular uptake. Then a solution containing one ML of 4 mg per mL dexamethasone and 3 mL of 2% MPF lidocaine was injected x0.5 mL Patient tolerated procedure well. Post procedure instructions were given. Please refer to post procedure form. 

## 2014-07-23 NOTE — Patient Instructions (Addendum)
Lumbar medial branch blocks were performed. This is to help diagnose the cause of the low back pain. It is important that you keep track of your pain for the first day or 2 after injection. This injection can give you temporary relief that lasts for hours or up to several months. There is no way to predict duration of pain relief.  Please try to compare your pain after injection to for the injection.  If this injection gives you  temporary relief there may be another longer-lasting procedure that may be beneficial call radiofrequency ablation                                                              Trochanteric Bursitis You have hip pain due to trochanteric bursitis. Bursitis means that the sack near the outside of the hip is filled with fluid and inflamed. This sack is made up of protective soft tissue. The pain from trochanteric bursitis can be severe and keep you from sleep. It can radiate to the buttocks or down the outside of the thigh to the knee. The pain is almost always worse when rising from the seated or lying position and with walking. Pain can improve after you take a few steps. It happens more often in people with hip joint and lumbar spine problems, such as arthritis or previous surgery. Very rarely the trochanteric bursa can become infected, and antibiotics and/or surgery may be needed. Treatment often includes an injection of local anesthetic mixed with cortisone medicine. This medicine is injected into the area where it is most tender over the hip. Repeat injections may be necessary if the response to treatment is slow. You can apply ice packs over the tender area for 30 minutes every 2 hours for the next few days. Anti-inflammatory and/or narcotic pain medicine may also be helpful. Limit your activity for the next few days if the pain continues. See your caregiver in 5-10 days if you are not greatly improved.  SEEK IMMEDIATE MEDICAL CARE IF:  You develop severe pain, fever, or  increased redness.  You have pain that radiates below the knee. EXERCISES STRETCHING EXERCISES - Trochanteric Bursitis  These exercises may help you when beginning to rehabilitate your injury. Your symptoms may resolve with or without further involvement from your physician, physical therapist, or athletic trainer. While completing these exercises, remember:   Restoring tissue flexibility helps normal motion to return to the joints. This allows healthier, less painful movement and activity.  An effective stretch should be held for at least 30 seconds.  A stretch should never be painful. You should only feel a gentle lengthening or release in the stretched tissue. STRETCH - Iliotibial Band  On the floor or bed, lie on your side so your injured leg is on top. Bend your knee and grab your ankle.  Slowly bring your knee back so that your thigh is in line with your trunk. Keep your heel at your buttocks and gently arch your back so your head, shoulders and hips line up.  Slowly lower your leg so that your knee approaches the floor/bed until you feel a gentle stretch on the outside of your thigh. If you do not feel a stretch and your knee will not fall farther, place the heel of your opposite  foot on top of your knee and pull your thigh down farther.  Hold this stretch for __________ seconds.  Repeat __________ times. Complete this exercise __________ times per day. STRETCH - Hamstrings, Supine   Lie on your back. Loop a belt or towel over the ball of your foot as shown.  Straighten your knee and slowly pull on the belt to raise your injured leg. Do not allow the knee to bend. Keep your opposite leg flat on the floor.  Raise the leg until you feel a gentle stretch behind your knee or thigh. Hold this position for __________ seconds.  Repeat __________ times. Complete this stretch __________ times per day. STRETCH - Quadriceps, Prone   Lie on your stomach on a firm surface, such as a bed  or padded floor.  Bend your knee and grasp your ankle. If you are unable to reach your ankle or pant leg, use a belt around your foot to lengthen your reach.  Gently pull your heel toward your buttocks. Your knee should not slide out to the side. You should feel a stretch in the front of your thigh and/or knee.  Hold this position for __________ seconds.  Repeat __________ times. Complete this stretch __________ times per day. STRETCHING - Hip Flexors, Lunge Half kneel with your knee on the floor and your opposite knee bent and directly over your ankle.  Keep good posture with your head over your shoulders. Tighten your buttocks to point your tailbone downward; this will prevent your back from arching too much.  You should feel a gentle stretch in the front of your thigh and/or hip. If you do not feel any resistance, slightly slide your opposite foot forward and then slowly lunge forward so your knee once again lines up over your ankle. Be sure your tailbone remains pointed downward.  Hold this stretch for __________ seconds.  Repeat __________ times. Complete this stretch __________ times per day. STRETCH - Adductors, Lunge  While standing, spread your legs.  Lean away from your injured leg by bending your opposite knee. You may rest your hands on your thigh for balance.  You should feel a stretch in your inner thigh. Hold for __________ seconds.  Repeat __________ times. Complete this exercise __________ times per day. Document Released: 10/19/2004 Document Revised: 01/26/2014 Document Reviewed: 12/24/2008 [facility] Patient Information 2015 [place], [place]. This information is not intended to replace advice given to you by your health care provider. Make sure you discuss any questions you have with your health care provider.

## 2014-07-28 DIAGNOSIS — G894 Chronic pain syndrome: Secondary | ICD-10-CM | POA: Diagnosis not present

## 2014-08-05 DIAGNOSIS — R399 Unspecified symptoms and signs involving the genitourinary system: Secondary | ICD-10-CM | POA: Diagnosis not present

## 2014-09-25 HISTORY — PX: OTHER SURGICAL HISTORY: SHX169

## 2014-09-26 DIAGNOSIS — M4850XS Collapsed vertebra, not elsewhere classified, site unspecified, sequela of fracture: Secondary | ICD-10-CM | POA: Diagnosis not present

## 2014-09-26 DIAGNOSIS — M5126 Other intervertebral disc displacement, lumbar region: Secondary | ICD-10-CM | POA: Diagnosis not present

## 2014-09-26 DIAGNOSIS — M5136 Other intervertebral disc degeneration, lumbar region: Secondary | ICD-10-CM | POA: Diagnosis not present

## 2014-09-26 DIAGNOSIS — M545 Low back pain: Secondary | ICD-10-CM | POA: Diagnosis not present

## 2014-10-06 ENCOUNTER — Emergency Department (HOSPITAL_COMMUNITY): Payer: Medicare Other

## 2014-10-06 ENCOUNTER — Emergency Department (HOSPITAL_COMMUNITY)
Admission: EM | Admit: 2014-10-06 | Discharge: 2014-10-06 | Disposition: A | Discharge status: Home / Self Care | Payer: Medicare Other | Attending: Emergency Medicine | Admitting: Emergency Medicine

## 2014-10-06 ENCOUNTER — Encounter (HOSPITAL_COMMUNITY): Payer: Self-pay | Admitting: Emergency Medicine

## 2014-10-06 DIAGNOSIS — R0789 Other chest pain: Secondary | ICD-10-CM | POA: Diagnosis not present

## 2014-10-06 DIAGNOSIS — Z7952 Long term (current) use of systemic steroids: Secondary | ICD-10-CM | POA: Insufficient documentation

## 2014-10-06 DIAGNOSIS — Z79899 Other long term (current) drug therapy: Secondary | ICD-10-CM | POA: Insufficient documentation

## 2014-10-06 DIAGNOSIS — Z86711 Personal history of pulmonary embolism: Secondary | ICD-10-CM | POA: Insufficient documentation

## 2014-10-06 DIAGNOSIS — N644 Mastodynia: Secondary | ICD-10-CM | POA: Diagnosis not present

## 2014-10-06 DIAGNOSIS — M797 Fibromyalgia: Secondary | ICD-10-CM | POA: Insufficient documentation

## 2014-10-06 DIAGNOSIS — Z86718 Personal history of other venous thrombosis and embolism: Secondary | ICD-10-CM | POA: Insufficient documentation

## 2014-10-06 DIAGNOSIS — R52 Pain, unspecified: Secondary | ICD-10-CM

## 2014-10-06 DIAGNOSIS — E079 Disorder of thyroid, unspecified: Secondary | ICD-10-CM | POA: Insufficient documentation

## 2014-10-06 DIAGNOSIS — M1990 Unspecified osteoarthritis, unspecified site: Secondary | ICD-10-CM | POA: Insufficient documentation

## 2014-10-06 DIAGNOSIS — R079 Chest pain, unspecified: Secondary | ICD-10-CM | POA: Diagnosis present

## 2014-10-06 DIAGNOSIS — M546 Pain in thoracic spine: Secondary | ICD-10-CM | POA: Diagnosis not present

## 2014-10-06 DIAGNOSIS — Z8669 Personal history of other diseases of the nervous system and sense organs: Secondary | ICD-10-CM | POA: Diagnosis not present

## 2014-10-06 DIAGNOSIS — I10 Essential (primary) hypertension: Secondary | ICD-10-CM | POA: Diagnosis not present

## 2014-10-06 DIAGNOSIS — Z8619 Personal history of other infectious and parasitic diseases: Secondary | ICD-10-CM | POA: Diagnosis not present

## 2014-10-06 HISTORY — DX: Unspecified osteoarthritis, unspecified site: M19.90

## 2014-10-06 HISTORY — DX: Essential (primary) hypertension: I10

## 2014-10-06 LAB — CBC WITH DIFFERENTIAL/PLATELET
BASOS ABS: 0.1 10*3/uL (ref 0.0–0.1)
Basophils Relative: 1 % (ref 0–1)
EOS PCT: 3 % (ref 0–5)
Eosinophils Absolute: 0.2 10*3/uL (ref 0.0–0.7)
HCT: 44.6 % (ref 36.0–46.0)
HEMOGLOBIN: 14.5 g/dL (ref 12.0–15.0)
LYMPHS ABS: 1.9 10*3/uL (ref 0.7–4.0)
Lymphocytes Relative: 25 % (ref 12–46)
MCH: 30.5 pg (ref 26.0–34.0)
MCHC: 32.5 g/dL (ref 30.0–36.0)
MCV: 93.7 fL (ref 78.0–100.0)
Monocytes Absolute: 0.8 10*3/uL (ref 0.1–1.0)
Monocytes Relative: 10 % (ref 3–12)
NEUTROS ABS: 4.9 10*3/uL (ref 1.7–7.7)
NEUTROS PCT: 61 % (ref 43–77)
Platelets: 266 10*3/uL (ref 150–400)
RBC: 4.76 MIL/uL (ref 3.87–5.11)
RDW: 14.2 % (ref 11.5–15.5)
WBC: 7.9 10*3/uL (ref 4.0–10.5)

## 2014-10-06 LAB — COMPREHENSIVE METABOLIC PANEL
ALBUMIN: 4.3 g/dL (ref 3.5–5.2)
ALT: 17 U/L (ref 0–35)
AST: 37 U/L (ref 0–37)
Alkaline Phosphatase: 78 U/L (ref 39–117)
Anion gap: 9 (ref 5–15)
BILIRUBIN TOTAL: 0.8 mg/dL (ref 0.3–1.2)
BUN: 11 mg/dL (ref 6–23)
CO2: 29 mmol/L (ref 19–32)
CREATININE: 0.88 mg/dL (ref 0.50–1.10)
Calcium: 9.5 mg/dL (ref 8.4–10.5)
Chloride: 99 mEq/L (ref 96–112)
GFR calc Af Amer: 74 mL/min — ABNORMAL LOW (ref >90)
GFR calc non Af Amer: 64 mL/min — ABNORMAL LOW (ref >90)
Glucose, Bld: 93 mg/dL (ref 70–99)
Potassium: 5 mmol/L (ref 3.5–5.1)
SODIUM: 137 mmol/L (ref 135–145)
Total Protein: 7.5 g/dL (ref 6.0–8.3)

## 2014-10-06 LAB — LIPASE, BLOOD: LIPASE: 26 U/L (ref 11–59)

## 2014-10-06 LAB — TROPONIN I: Troponin I: <0.03 ng/mL (ref <0.031)

## 2014-10-06 MED ORDER — SODIUM CHLORIDE 0.9 % IV SOLN
Freq: Once | INTRAVENOUS | Status: AC
  Administered 2014-10-06: 09:00:00 via INTRAVENOUS

## 2014-10-06 MED ORDER — HYDROMORPHONE HCL 1 MG/ML IJ SOLN
1.0000 mg | Freq: Once | INTRAMUSCULAR | Status: AC
  Administered 2014-10-06: 1 mg via INTRAVENOUS
  Filled 2014-10-06: qty 1

## 2014-10-06 MED ORDER — KETOROLAC TROMETHAMINE 30 MG/ML IJ SOLN
30.0000 mg | Freq: Once | INTRAMUSCULAR | Status: AC
  Administered 2014-10-06: 30 mg via INTRAVENOUS
  Filled 2014-10-06: qty 1

## 2014-10-06 NOTE — Discharge Instructions (Signed)
As discussed, your evaluation today has been largely reassuring.  But, it is important that you monitor your condition carefully, and do not hesitate to return to the ED if you develop new, or concerning changes in your condition. ? ?Otherwise, please follow-up with your physician for appropriate ongoing care. ? ?

## 2014-10-06 NOTE — ED Notes (Addendum)
Pt states she has not been feel;ig good for the past 3 days, states yesterday she could not burp and this morning she c/o chest tightness. Pt also c/o of a wrapping around pain to left side, stating she thinks it may be her spleen.

## 2014-10-06 NOTE — ED Provider Notes (Signed)
CSN: [id number]     Arrival date & time 10/06/14  4782 History   First MD Initiated Contact with Patient 10/06/14 0827     Chief Complaint  Patient presents with  . Chest Pain   HPI  Patient presents with multiple concerns. In essence over the past 3 weeks the patient has had episodes of left flank/axillary pain. Over the past 24 hours she has also developed chest pain, nausea, anorexia, lightheadedness, near syncope. Prior to approximately 3 weeks ago patient was generally well. She notes that during that time she has developed episodes, typically with prolonged sitting position, that improved with application of pressure to her left infracostal posterior region. The pain is sore, severe, radiates from just lateral to the left lower thoracic area to the left lateral abdomen. There is no concurrent nausea, vomiting, fever, chills, bowel habit changes. Over the past 24 hours, patient has also developed the aforementioned anterior chest pressure, dyspnea, lightheadedness. Patient has not taken any medication for anything thus far. Patient has a history in the distant past of pulmonary embolism, as well as multiple other medical issues, including hypertension, fibromyalgia.  Past Medical History  Diagnosis Date  . Pulmonary embolus   . Lyme disease   . Blood clot in vein   . RSD (reflex sympathetic dystrophy)   . Fibromyalgia   . PMR (polymyalgia rheumatica)   . Thyroid disease   . Hypertension   . Arthritis    Past Surgical History  Procedure Laterality Date  . Cholecystectomy     Family History  Problem Relation Age of Onset  . Cancer Daughter    History  Substance Use Topics  . Smoking status: Never Smoker   . Smokeless tobacco: Never Used  . Alcohol Use: No   OB History    No data available     Review of Systems  Constitutional:       Per HPI, otherwise negative  HENT:       Per HPI, otherwise negative  Respiratory:       Per HPI, otherwise negative   Cardiovascular:       Per HPI, otherwise negative  Gastrointestinal: Negative for vomiting.  Endocrine:       Negative aside from HPI  Genitourinary:       Neg aside from HPI   Musculoskeletal:       Per HPI, otherwise negative  Skin: Negative.   Neurological: Negative for syncope.      Allergies  Ivp dye; Gabapentin; and Codeine  Home Medications   Prior to Admission medications   Medication Sig Start Date End Date Taking? Authorizing Provider  citalopram (CELEXA) 20 MG tablet Take 30 mg by mouth daily.    Historical Provider, MD  cyclobenzaprine (FLEXERIL) 10 MG tablet Take 1 tablet by mouth 3 (three) times daily. 10/08/12   Historical Provider, MD  gabapentin (NEURONTIN) 100 MG capsule Take 1 capsule (100 mg total) by mouth 3 (three) times daily. 04/24/14   Erick Colace, MD  levothyroxine (SYNTHROID, LEVOTHROID) 88 MCG tablet Take 88 mcg by mouth daily.    Historical Provider, MD  lidocaine (LIDODERM) 5 % Place 1 patch onto the skin daily. Remove & Discard patch within 12 hours or as directed by MD    Historical Provider, MD  prednisoLONE 5 MG TABS Take 5 mg by mouth daily.    Historical Provider, MD  simvastatin (ZOCOR) 10 MG tablet Take 10 mg by mouth at bedtime.    Historical Provider, MD  verapamil (COVERA HS) 240 MG (CO) 24 hr tablet Take 80 mg by mouth 3 (three) times daily.    Historical Provider, MD   BP 183/103 mmHg  Pulse 89  Temp(Src) 97.5 F (36.4 C) (Oral)  Resp 17  SpO2 98% Physical Exam  Constitutional: She is oriented to person, place, and time. She appears well-developed and well-nourished. No distress.  HENT:  Head: Normocephalic and atraumatic.  Eyes: Conjunctivae and EOM are normal.  Cardiovascular: Normal rate and regular rhythm.   Pulmonary/Chest: Effort normal and breath sounds normal. No stridor. No respiratory distress.  Abdominal: She exhibits no distension. There is no tenderness. There is no rebound.  No appreciable discomfort w  palpation.  Musculoskeletal: She exhibits no edema.  No pain elicited w pressure about the L lateral lower axilla  Neurological: She is alert and oriented to person, place, and time. No cranial nerve deficit.  Skin: Skin is warm and dry.  Psychiatric: She has a normal mood and affect.  Nursing note and vitals reviewed.   ED Course  Procedures (including critical care time) Labs Review Labs Reviewed  COMPREHENSIVE METABOLIC PANEL - Abnormal; Notable for the following:    GFR calc non Af Amer 64 (*)    GFR calc Af Amer 74 (*)    All other components within normal limits  TROPONIN I  LIPASE, BLOOD  CBC WITH DIFFERENTIAL    Imaging Review Dg Thoracic Spine W/swimmers  10/06/2014   CLINICAL DATA:  Pain under left breast.  No known trauma.  EXAM: THORACIC SPINE - 2 VIEW + SWIMMERS  COMPARISON:  MRI lumbar spine 09/26/2014  FINDINGS: There is a chronic L1 vertebral body compression fracture. The remainder of the vertebral body heights are maintained. The alignment is anatomic. There is no acute fracture or static listhesis. The disc spaces are maintained.  The visualized portions of the lungs are clear.  IMPRESSION: 1. No acute osseous injury of the thoracic spine.   Electronically Signed   By: Elige KoHetal  Patel   On: 10/06/2014 09:46     EKG Interpretation   Date/Time:  Tuesday October 06 2014 08:20:46 EST Ventricular Rate:  95 PR Interval:  191 QRS Duration: 91 QT Interval:  367 QTC Calculation: 461 R Axis:   60 Text Interpretation:  Sinus tachycardia Multiple ventricular premature  complexes Anteroseptal infarct, age indeterminate Sinus tachycardia  Premature ventricular complexes No significant change since last tracing  Abnormal ekg Confirmed by Gerhard MunchLOCKWOOD, Kyel Purk  MD [phone]([id number]) on 10/06/2014 8:28:19  AM     O2- 99%ra, nml  Cardiac: 90 sr, nml  10:36 AM Patient in no distress.  She is comfortable. We discussed all findings.  Patient states that she has follow-up with both  neurosurgery and pain management clinic scheduled this week. We discussed the need for continued evaluation as an outpatient after today's reassuring findings.  MDM   Final diagnoses:  Pain   patient presents with new right sided lower axillary pain. Patient is awake and alert, neurologically intact, hemodynamically stable here.  Evaluation is largely reassuring, with no findings consistent with ongoing coronary ischemia, nor with acute pathology. Patient's description of pain that is worse with prolonged sitting position, then standing up suggests musculoskeletal etiology. Patient is already enrolled in a pain management clinic, and has appropriate outpatient follow-up with physicians as well. Patient discharged in stable condition.    Gerhard Munchobert Jhostin Epps, MD 10/06/14 1038

## 2014-10-06 NOTE — ED Notes (Signed)
Patient was educated not to drive, operate heavy machinery, or drink alcohol while taking narcotic medication.  

## 2014-10-08 DIAGNOSIS — Z6826 Body mass index (BMI) 26.0-26.9, adult: Secondary | ICD-10-CM | POA: Diagnosis not present

## 2014-10-08 DIAGNOSIS — S32001A Stable burst fracture of unspecified lumbar vertebra, initial encounter for closed fracture: Secondary | ICD-10-CM | POA: Diagnosis not present

## 2014-10-08 DIAGNOSIS — I10 Essential (primary) hypertension: Secondary | ICD-10-CM | POA: Diagnosis not present

## 2014-10-12 DIAGNOSIS — G894 Chronic pain syndrome: Secondary | ICD-10-CM | POA: Diagnosis not present

## 2014-10-12 DIAGNOSIS — F419 Anxiety disorder, unspecified: Secondary | ICD-10-CM | POA: Diagnosis not present

## 2014-10-12 DIAGNOSIS — I10 Essential (primary) hypertension: Secondary | ICD-10-CM | POA: Diagnosis not present

## 2014-10-13 ENCOUNTER — Encounter: Payer: Medicare Other | Attending: Physical Medicine & Rehabilitation

## 2014-10-13 ENCOUNTER — Ambulatory Visit (HOSPITAL_BASED_OUTPATIENT_CLINIC_OR_DEPARTMENT_OTHER): Payer: Medicare Other | Admitting: Physical Medicine & Rehabilitation

## 2014-10-13 ENCOUNTER — Encounter: Payer: Self-pay | Admitting: Physical Medicine & Rehabilitation

## 2014-10-13 VITALS — BP 140/82 | HR 84 | Resp 14

## 2014-10-13 DIAGNOSIS — M7062 Trochanteric bursitis, left hip: Secondary | ICD-10-CM | POA: Diagnosis not present

## 2014-10-13 DIAGNOSIS — M47817 Spondylosis without myelopathy or radiculopathy, lumbosacral region: Secondary | ICD-10-CM | POA: Diagnosis not present

## 2014-10-13 DIAGNOSIS — M7061 Trochanteric bursitis, right hip: Secondary | ICD-10-CM | POA: Diagnosis not present

## 2014-10-13 MED ORDER — DULOXETINE HCL 20 MG PO CPEP: 20.0000 mg | ORAL_CAPSULE | Freq: Every day | ORAL | Status: DC

## 2014-10-13 NOTE — Progress Notes (Signed)
Subjective:    Patient ID: Elizabeth [name], female    DOB: 1941-04-11, 74 y.o.   MRN: [id number]  HPI   74 year old female with history of chronic low back pain. Has had good pain relief in the past with sacroiliac injections greater than 1 year ago. She has had a fall resulting in lumbar compression fracture and had a vertebral plasty for this with good results. More recently she has undergone medial branch blocks for chronic lumbar facet pain. Last procedure performed  07/23/2014 right L3-L4 medial branch and right L5 dorsal ramus injection. Patient reported she had excellent results with this was able to go on a cruise without pain.  Interval history she's developed all over body aching as well as exacerbation of low back pain as well as bilateral hip pain. Her hip pain does not allow her to sleep on her sides at night. She notes a prior history of fibromyalgia syndrome. Has tried Gabapentin which has caused confusion , is reluctant to try Lyrica. Currently taking Celexa for depression. She does not recall whether she has tried Cymbalta in the past   Pain Inventory Average Pain 10 Pain Right Now 10 My pain is constant, sharp, stabbing and aching  In the last 24 hours, has pain interfered with the following? General activity 9 Relation with others 10 Enjoyment of life 10 What TIME of day is your pain at its worst? ALL Sleep (in general) Poor  Pain is worse with: walking, bending, sitting, standing and some activites Pain improves with: rest and medication Relief from Meds: 4  Mobility walk without assistance ability to climb steps?  yes do you drive?  yes  Function retired  Neuro/Psych weakness numbness tingling spasms  Prior Studies Any changes since last visit?  no  Physicians involved in your care Any changes since last visit?  no   Family History  Problem Relation Age of Onset  . Cancer Daughter    History   Social History  . Marital Status: Widowed      Spouse Name: N/A    Number of Children: N/A  . Years of Education: N/A   Social History Main Topics  . Smoking status: Never Smoker   . Smokeless tobacco: Never Used  . Alcohol Use: No  . Drug Use: No  . Sexual Activity: None   Other Topics Concern  . None   Social History Narrative   Past Surgical History  Procedure Laterality Date  . Cholecystectomy     Past Medical History  Diagnosis Date  . Pulmonary embolus   . Lyme disease   . Blood clot in vein   . RSD (reflex sympathetic dystrophy)   . Fibromyalgia   . PMR (polymyalgia rheumatica)   . Thyroid disease   . Hypertension   . Arthritis    BP 140/82 mmHg  Pulse 84  Resp 14  SpO2 98%  Opioid Risk Score:   Fall Risk Score: Moderate Fall Risk (6-13 points)   Review of Systems  Constitutional: Negative.   HENT: Negative.   Eyes: Negative.   Respiratory: Negative.   Cardiovascular: Negative.   Gastrointestinal: Negative.   Endocrine: Negative.   Genitourinary: Negative.   Musculoskeletal: Positive for myalgias, back pain and arthralgias.  Skin: Negative.   Allergic/Immunologic: Negative.   Neurological: Negative.   Hematological: Negative.   Psychiatric/Behavioral: Positive for dysphoric mood. The patient is nervous/anxious.        Objective:   Physical Exam Tenderness palpation bilateral greater trochanters of the  hip. Tenderness along the lumbar paraspinal muscles. Good lumbar flexion Pain with lumbar extension pain with lateral bending limited range of motion with extension as well as lateral bending but normal forward flexion Negative straight leg raising test Motor strength is normal in bilateral upper and lower extremities Musculoskeletal has tenderness palpation along the thoracic and lumbar paraspinal muscles. Also pain to palpation in the neck area as well as hips  Mood and affect flat Gen. No acute distress       Assessment & Plan:  1. Lumbar facet arthrosis, reviewed recent MRI  from January 2016 which demonstrates severe facet arthrosis L4-5 and L5-S1. No significant nerve root impingement. Previous good results with L3 L4 L5 medial branch blocks. We'll schedule for repeat  2. Bilateral hip pain consistent with trochanteric bursitis will inject today  Bilateral Trochanteric bursa injection  without ultrasound guidance  Indication Trochanteric bursitis. Exam has tenderness over the greater trochanter of the hip. Pain has not responded to conservative care such as exercise therapy and oral medications. Pain interferes with sleep or with mobility Informed consent was obtained after describing risks and benefits of the procedure with the patient these include bleeding bruising and infection. Patient has signed written consent form. Patient placed in a lateral decubitus position with the affected hip superior. Point of maximal pain was palpated marked and prepped with Betadine and entered with a needle to bone contact. Needle slightly withdrawn then  of betamethasone with 4 cc 1% lidocaine were injected. Patient tolerated procedure well. Post procedure instructions given.  3. Diffuse body aches with diffuse tenderness above and below waist. This is most consistent with fiber neuralgia syndrome. We'll discontinue Celexa and start Cymbalta  a day. We did discuss that it can take several weeks before effects take place, may need to increase to 30 mg per day

## 2014-10-13 NOTE — Patient Instructions (Signed)
You have arthritis of your lower spine causing low back pain  You have fibromyalgia which causes the all over body aching. We are discontinuing the Celexa and starting Cymbalta. You have to be patient with this medication as it may take a few weeks to take full effect. We also may need to increase the dosage of this medication  Trochanteric bursa injections were done today.

## 2014-10-22 ENCOUNTER — Ambulatory Visit: Payer: Medicare Other | Admitting: Physical Medicine & Rehabilitation

## 2014-10-29 ENCOUNTER — Encounter: Payer: Medicare Other | Attending: Physical Medicine & Rehabilitation

## 2014-10-29 ENCOUNTER — Encounter: Payer: Self-pay | Admitting: Physical Medicine & Rehabilitation

## 2014-10-29 ENCOUNTER — Ambulatory Visit (HOSPITAL_BASED_OUTPATIENT_CLINIC_OR_DEPARTMENT_OTHER): Payer: Medicare Other | Admitting: Physical Medicine & Rehabilitation

## 2014-10-29 DIAGNOSIS — M47817 Spondylosis without myelopathy or radiculopathy, lumbosacral region: Secondary | ICD-10-CM | POA: Insufficient documentation

## 2014-10-29 DIAGNOSIS — M7062 Trochanteric bursitis, left hip: Secondary | ICD-10-CM | POA: Diagnosis not present

## 2014-10-29 DIAGNOSIS — M7061 Trochanteric bursitis, right hip: Secondary | ICD-10-CM | POA: Insufficient documentation

## 2014-10-29 MED ORDER — DULOXETINE HCL 20 MG PO CPEP: 20.0000 mg | ORAL_CAPSULE | Freq: Two times a day (BID) | ORAL | Status: DC

## 2014-10-29 NOTE — Patient Instructions (Signed)

## 2014-10-29 NOTE — Progress Notes (Signed)

## 2014-10-29 NOTE — Progress Notes (Signed)
  PROCEDURE RECORD [facility]   Name: Elizabeth HenleFrances [name] DOB:03-30-1941 MRN: [id number]  Date:10/29/2014  Physician: Claudette LawsAndrew Kirsteins, MD    Nurse/CMA: Shumaker RN  Allergies:  Allergies  Allergen Reactions  . Ivp Dye [Iodinated Diagnostic Agents] Anaphylaxis  . Gabapentin     confusion  . Codeine Palpitations    Consent Signed: Yes.    Is patient diabetic? No.  CBG today?   Pregnant: No. LMP: No LMP recorded. Patient is postmenopausal. (age [age])  Anticoagulants: no Anti-inflammatory: no Antibiotics: no  Procedure: Bilateral MBB L3-4-5 Position: Prone Start Time: 10:49 End Time: 11:00 Fluoro Time: 37 sec 37sec RN/CMA Haematologisthumaker RN Shumaker RN    Time 10:00 AM 10:09    BP 155/84 162/91    Pulse 61 88    Respirations 14 14    O2 Sat 95% 98    S/S 6 6    Pain Level 7/10 0/10     D/C home with Consuella LoseElaine, patient A & O X 3, D/C instructions reviewed, and sits independently.

## 2014-12-03 DIAGNOSIS — G894 Chronic pain syndrome: Secondary | ICD-10-CM | POA: Diagnosis not present

## 2014-12-03 DIAGNOSIS — F419 Anxiety disorder, unspecified: Secondary | ICD-10-CM | POA: Diagnosis not present

## 2014-12-03 DIAGNOSIS — E785 Hyperlipidemia, unspecified: Secondary | ICD-10-CM | POA: Diagnosis not present

## 2014-12-03 DIAGNOSIS — M810 Age-related osteoporosis without current pathological fracture: Secondary | ICD-10-CM | POA: Diagnosis not present

## 2015-01-28 ENCOUNTER — Ambulatory Visit: Payer: Medicare Other | Admitting: Physical Medicine & Rehabilitation

## 2015-01-28 ENCOUNTER — Ambulatory Visit: Payer: Medicare Other

## 2015-02-01 DIAGNOSIS — M542 Cervicalgia: Secondary | ICD-10-CM | POA: Diagnosis not present

## 2015-02-01 DIAGNOSIS — Z6825 Body mass index (BMI) 25.0-25.9, adult: Secondary | ICD-10-CM | POA: Diagnosis not present

## 2015-02-03 DIAGNOSIS — M542 Cervicalgia: Secondary | ICD-10-CM | POA: Diagnosis not present

## 2015-02-03 DIAGNOSIS — M5032 Other cervical disc degeneration, mid-cervical region: Secondary | ICD-10-CM | POA: Diagnosis not present

## 2015-02-03 DIAGNOSIS — M4802 Spinal stenosis, cervical region: Secondary | ICD-10-CM | POA: Diagnosis not present

## 2015-02-03 DIAGNOSIS — M47812 Spondylosis without myelopathy or radiculopathy, cervical region: Secondary | ICD-10-CM | POA: Diagnosis not present

## 2015-02-03 DIAGNOSIS — M9971 Connective tissue and disc stenosis of intervertebral foramina of cervical region: Secondary | ICD-10-CM | POA: Diagnosis not present

## 2015-02-05 ENCOUNTER — Emergency Department (HOSPITAL_COMMUNITY): Payer: Medicare Other

## 2015-02-05 ENCOUNTER — Encounter (HOSPITAL_COMMUNITY): Payer: Self-pay | Admitting: Emergency Medicine

## 2015-02-05 ENCOUNTER — Emergency Department (HOSPITAL_COMMUNITY)
Admission: EM | Admit: 2015-02-05 | Discharge: 2015-02-06 | Disposition: A | Discharge status: Home / Self Care | Payer: Medicare Other | Attending: Emergency Medicine | Admitting: Emergency Medicine

## 2015-02-05 DIAGNOSIS — N2889 Other specified disorders of kidney and ureter: Secondary | ICD-10-CM | POA: Diagnosis not present

## 2015-02-05 DIAGNOSIS — Z86711 Personal history of pulmonary embolism: Secondary | ICD-10-CM | POA: Diagnosis not present

## 2015-02-05 DIAGNOSIS — Z79899 Other long term (current) drug therapy: Secondary | ICD-10-CM | POA: Diagnosis not present

## 2015-02-05 DIAGNOSIS — Z8619 Personal history of other infectious and parasitic diseases: Secondary | ICD-10-CM | POA: Diagnosis not present

## 2015-02-05 DIAGNOSIS — R1033 Periumbilical pain: Secondary | ICD-10-CM | POA: Diagnosis not present

## 2015-02-05 DIAGNOSIS — K6389 Other specified diseases of intestine: Secondary | ICD-10-CM | POA: Diagnosis not present

## 2015-02-05 DIAGNOSIS — R109 Unspecified abdominal pain: Secondary | ICD-10-CM | POA: Diagnosis present

## 2015-02-05 DIAGNOSIS — Q438 Other specified congenital malformations of intestine: Secondary | ICD-10-CM | POA: Diagnosis not present

## 2015-02-05 DIAGNOSIS — Z8669 Personal history of other diseases of the nervous system and sense organs: Secondary | ICD-10-CM | POA: Diagnosis not present

## 2015-02-05 DIAGNOSIS — E079 Disorder of thyroid, unspecified: Secondary | ICD-10-CM | POA: Diagnosis not present

## 2015-02-05 DIAGNOSIS — K529 Noninfective gastroenteritis and colitis, unspecified: Secondary | ICD-10-CM | POA: Insufficient documentation

## 2015-02-05 DIAGNOSIS — I10 Essential (primary) hypertension: Secondary | ICD-10-CM | POA: Diagnosis not present

## 2015-02-05 DIAGNOSIS — M1990 Unspecified osteoarthritis, unspecified site: Secondary | ICD-10-CM | POA: Insufficient documentation

## 2015-02-05 DIAGNOSIS — K7689 Other specified diseases of liver: Secondary | ICD-10-CM | POA: Diagnosis not present

## 2015-02-05 LAB — COMPREHENSIVE METABOLIC PANEL
ALBUMIN: 4.3 g/dL (ref 3.5–5.0)
ALK PHOS: 70 U/L (ref 38–126)
ALT: 19 U/L (ref 14–54)
ANION GAP: 9 (ref 5–15)
AST: 33 U/L (ref 15–41)
BUN: 13 mg/dL (ref 6–20)
CHLORIDE: 102 mmol/L (ref 101–111)
CO2: 28 mmol/L (ref 22–32)
CREATININE: 0.86 mg/dL (ref 0.44–1.00)
Calcium: 9.6 mg/dL (ref 8.9–10.3)
GFR calc Af Amer: >60 mL/min (ref >60)
GFR calc non Af Amer: >60 mL/min (ref >60)
Glucose, Bld: 110 mg/dL — ABNORMAL HIGH (ref 65–99)
Potassium: 4.5 mmol/L (ref 3.5–5.1)
SODIUM: 139 mmol/L (ref 135–145)
Total Bilirubin: 1 mg/dL (ref 0.3–1.2)
Total Protein: 7.4 g/dL (ref 6.5–8.1)

## 2015-02-05 LAB — CBC WITH DIFFERENTIAL/PLATELET
Basophils Absolute: 0 10*3/uL (ref 0.0–0.1)
Basophils Relative: 0 % (ref 0–1)
EOS ABS: 0.5 10*3/uL (ref 0.0–0.7)
EOS PCT: 4 % (ref 0–5)
HCT: 43.5 % (ref 36.0–46.0)
HEMOGLOBIN: 14.4 g/dL (ref 12.0–15.0)
Lymphocytes Relative: 15 % (ref 12–46)
Lymphs Abs: 2.1 10*3/uL (ref 0.7–4.0)
MCH: 30.9 pg (ref 26.0–34.0)
MCHC: 33.1 g/dL (ref 30.0–36.0)
MCV: 93.3 fL (ref 78.0–100.0)
Monocytes Absolute: 0.9 10*3/uL (ref 0.1–1.0)
Monocytes Relative: 6 % (ref 3–12)
Neutro Abs: 10.6 10*3/uL — ABNORMAL HIGH (ref 1.7–7.7)
Neutrophils Relative %: 75 % (ref 43–77)
PLATELETS: 251 10*3/uL (ref 150–400)
RBC: 4.66 MIL/uL (ref 3.87–5.11)
RDW: 13.8 % (ref 11.5–15.5)
WBC: 14 10*3/uL — AB (ref 4.0–10.5)

## 2015-02-05 LAB — URINE MICROSCOPIC-ADD ON

## 2015-02-05 LAB — I-STAT CG4 LACTIC ACID, ED: Lactic Acid, Venous: 0.7 mmol/L (ref 0.5–2.0)

## 2015-02-05 LAB — URINALYSIS, ROUTINE W REFLEX MICROSCOPIC
Bilirubin Urine: NEGATIVE
GLUCOSE, UA: NEGATIVE mg/dL
Ketones, ur: NEGATIVE mg/dL
Leukocytes, UA: NEGATIVE
NITRITE: NEGATIVE
PH: 7 (ref 5.0–8.0)
Protein, ur: NEGATIVE mg/dL
Specific Gravity, Urine: 1.009 (ref 1.005–1.030)
Urobilinogen, UA: 0.2 mg/dL (ref 0.0–1.0)

## 2015-02-05 LAB — LIPASE, BLOOD: Lipase: 19 U/L — ABNORMAL LOW (ref 22–51)

## 2015-02-05 MED ORDER — HYDROMORPHONE HCL 1 MG/ML IJ SOLN
1.0000 mg | Freq: Once | INTRAMUSCULAR | Status: AC
  Administered 2015-02-05: 1 mg via INTRAVENOUS
  Filled 2015-02-05: qty 1

## 2015-02-05 MED ORDER — DIPHENHYDRAMINE HCL 50 MG/ML IJ SOLN
25.0000 mg | Freq: Once | INTRAMUSCULAR | Status: DC
  Filled 2015-02-05: qty 1

## 2015-02-05 MED ORDER — SODIUM CHLORIDE 0.9 % IV BOLUS (SEPSIS)
1000.0000 mL | Freq: Once | INTRAVENOUS | Status: AC
  Administered 2015-02-05: 1000 mL via INTRAVENOUS

## 2015-02-05 MED ORDER — ONDANSETRON HCL 4 MG/2ML IJ SOLN
4.0000 mg | Freq: Once | INTRAMUSCULAR | Status: AC
  Administered 2015-02-05: 4 mg via INTRAVENOUS
  Filled 2015-02-05: qty 2

## 2015-02-05 MED ORDER — ONDANSETRON 8 MG PO TBDP: 8.0000 mg | ORAL_TABLET | Freq: Once | ORAL | Status: DC

## 2015-02-05 MED ORDER — METHYLPREDNISOLONE SODIUM SUCC 125 MG IJ SOLR
125.0000 mg | Freq: Once | INTRAMUSCULAR | Status: DC
  Filled 2015-02-05: qty 2

## 2015-02-05 MED ORDER — ONDANSETRON HCL 4 MG/2ML IJ SOLN: 4.0000 mg | Freq: Once | INTRAMUSCULAR | Status: DC

## 2015-02-05 MED ORDER — ONDANSETRON HCL 4 MG/2ML IJ SOLN
4.0000 mg | Freq: Once | INTRAMUSCULAR | Status: AC
Start: 2015-02-05 — End: 2015-02-05
  Administered 2015-02-05: 4 mg via INTRAVENOUS
  Filled 2015-02-05: qty 2

## 2015-02-05 MED ORDER — MORPHINE SULFATE 4 MG/ML IJ SOLN
4.0000 mg | Freq: Once | INTRAMUSCULAR | Status: AC
  Administered 2015-02-05: 4 mg via INTRAVENOUS
  Filled 2015-02-05: qty 1

## 2015-02-05 NOTE — ED Notes (Signed)
Pt from home c/o generalized abdominal pain and vomiting "black" emesis since around 1400. Pt reports x 10 episodes of vomiting.

## 2015-02-05 NOTE — ED Notes (Signed)
Pt actively vomiting in triage 

## 2015-02-05 NOTE — ED Provider Notes (Signed)
CSN: [id number]     Arrival date & time 02/05/15  1923 History   First MD Initiated Contact with Patient 02/05/15 2016     Chief Complaint  Patient presents with  . Abdominal Pain  . Emesis     (Consider location/radiation/quality/duration/timing/severity/associated sxs/prior Treatment) HPI Comments: Patient is a 74 year old female with a past medical history of PMR, hypertension, and lyme disease who presents with sudden onset of abdominal pain that started a 2pm today. The pain is located periumbilical area and does not radiate. The pain is described as aching and severe. The pain started gradually and progressively worsened since the onset. No alleviating/aggravating factors. The patient has tried nothing for symptoms without relief. Associated symptoms include nausea and dark green emesis. Patient denies fever, headache, diarrhea, chest pain, SOB, dysuria. Previous abdominal history includes cholecystectomy.    Past Medical History  Diagnosis Date  . Pulmonary embolus   . Lyme disease   . Blood clot in vein   . RSD (reflex sympathetic dystrophy)   . Fibromyalgia   . PMR (polymyalgia rheumatica)   . Thyroid disease   . Hypertension   . Arthritis    Past Surgical History  Procedure Laterality Date  . Cholecystectomy     Family History  Problem Relation Age of Onset  . Cancer Daughter    History  Substance Use Topics  . Smoking status: Never Smoker   . Smokeless tobacco: Never Used  . Alcohol Use: No   OB History    No data available     Review of Systems  Constitutional: Negative for fever, chills and fatigue.  HENT: Negative for trouble swallowing.   Eyes: Negative for visual disturbance.  Respiratory: Negative for shortness of breath.   Cardiovascular: Negative for chest pain and palpitations.  Gastrointestinal: Positive for nausea, vomiting and abdominal pain. Negative for diarrhea.  Genitourinary: Negative for dysuria and difficulty urinating.   Musculoskeletal: Negative for arthralgias and neck pain.  Skin: Negative for color change.  Neurological: Negative for dizziness and weakness.  Psychiatric/Behavioral: Negative for dysphoric mood.      Allergies  Ivp dye; Gabapentin; and Codeine  Home Medications   Prior to Admission medications   Medication Sig Start Date End Date Taking? Authorizing Provider  cyclobenzaprine (FLEXERIL) 10 MG tablet Take 10 mg by mouth 3 (three) times daily as needed. 09/28/14   Historical Provider, MD  diazepam (VALIUM) 5 MG tablet Take 5 mg by mouth every 6 (six) hours as needed for anxiety.    Historical Provider, MD  DULoxetine (CYMBALTA) 20 MG capsule Take 1 capsule (20 mg total) by mouth 2 (two) times daily. 10/29/14   Erick Colace, MD  levothyroxine (SYNTHROID, LEVOTHROID) 75 MCG tablet Take 75 mcg by mouth daily before breakfast.    Historical Provider, MD  lidocaine (LIDODERM) 5 % Place 1 patch onto the skin daily. Remove & Discard patch within 12 hours or as directed by MD    Historical Provider, MD  oxyCODONE (OXY IR/ROXICODONE) 5 MG immediate release tablet  10/13/14   Historical Provider, MD  oxyCODONE-acetaminophen (PERCOCET/ROXICET) 5-325 MG per tablet Take 1 tablet by mouth every 4 (four) hours as needed for severe pain.    Historical Provider, MD  predniSONE (DELTASONE) 1 MG tablet Take 0.5 mg by mouth 2 (two) times daily.    Historical Provider, MD  simvastatin (ZOCOR) 10 MG tablet Take 10 mg by mouth at bedtime.    Historical Provider, MD  verapamil (CALAN) 80 MG  tablet Take 80 mg by mouth 2 (two) times daily.    Historical Provider, MD   BP 133/106 mmHg  Pulse 78  Temp(Src) 98.2 F (36.8 C) (Oral)  Resp 20  SpO2 98% Physical Exam  Constitutional: She is oriented to person, place, and time. She appears well-developed and well-nourished. No distress.  HENT:  Head: Normocephalic and atraumatic.  Eyes: Conjunctivae are normal.  Neck: Normal range of motion.  Cardiovascular:  Normal rate and regular rhythm.  Exam reveals no gallop and no friction rub.   No murmur heard. Pulmonary/Chest: Effort normal and breath sounds normal. She has no wheezes. She has no rales. She exhibits no tenderness.  Abdominal: Soft. She exhibits no distension. There is tenderness. There is no rebound.  Periumbilical tenderness to palpation. No peritoneal signs.   Musculoskeletal: Normal range of motion.  Neurological: She is alert and oriented to person, place, and time. Coordination normal.  Speech is goal-oriented. Moves limbs without ataxia.   Skin: Skin is warm and dry.  Psychiatric: She has a normal mood and affect. Her behavior is normal.  Nursing note and vitals reviewed.   ED Course  Procedures (including critical care time) Labs Review Labs Reviewed  CBC WITH DIFFERENTIAL/PLATELET - Abnormal; Notable for the following:    WBC 14.0 (*)    Neutro Abs 10.6 (*)    All other components within normal limits  COMPREHENSIVE METABOLIC PANEL - Abnormal; Notable for the following:    Glucose, Bld 110 (*)    All other components within normal limits  LIPASE, BLOOD - Abnormal; Notable for the following:    Lipase 19 (*)    All other components within normal limits  URINALYSIS, ROUTINE W REFLEX MICROSCOPIC - Abnormal; Notable for the following:    Hgb urine dipstick MODERATE (*)    All other components within normal limits  URINE MICROSCOPIC-ADD ON  I-STAT CG4 LACTIC ACID, ED    Imaging Review Ct Abdomen Pelvis Wo Contrast  02/05/2015   CLINICAL DATA:  Acute onset of generalized abdominal pain and vomiting. Initial encounter.  EXAM: CT ABDOMEN AND PELVIS WITHOUT CONTRAST  TECHNIQUE: Multidetector CT imaging of the abdomen and pelvis was performed following the standard protocol without IV contrast.  COMPARISON:  MRI of the lumbar spine performed 09/26/2014, and CT of the abdomen and pelvis from 01/07/2012  FINDINGS: The visualized lung bases are clear.  A 2.9 cm cyst is noted at  the hepatic dome, relatively stable from 2013. Smaller nonspecific hypodensities are seen within the inferior tip of the liver. The liver and spleen are otherwise unremarkable. The patient is status post cholecystectomy. Nodularity about the proximal aspect of the pancreatic body is relatively stable from 2013 and is thought to reflect a normal variant, without a definite mass. The adrenal glands are within normal limits.  Mild focal left renal scarring is noted. There is mild left-sided pelvicaliectasis, without evidence of distal obstructing stone or significant hydronephrosis. The right kidney is unremarkable in appearance. No renal or ureteral stones are seen. No perinephric stranding is appreciated.  No free fluid is identified. The small bowel is unremarkable in appearance. The stomach is within normal limits. No acute vascular abnormalities are seen. Minimal calcification is seen along the abdominal aorta. An IVC filter is noted overlying expected position. It is slightly inferior, but still within normal limits.  The appendix is not definitely seen; there is no evidence of appendicitis.  Mild soft tissue inflammation and trace fluid are seen along the ascending  colon. This may surround a large lobule of fat, and could reflect epiploic appendagitis or possibly a small omental infarct, though the fat itself does not demonstrate significant inflammation. The colon is otherwise unremarkable.  The bladder is mildly distended and grossly unremarkable in appearance. The uterus is grossly unremarkable. The ovaries are relatively symmetric. Trace free fluid within the pelvis may arise from the right mid abdominal process. No inguinal lymphadenopathy is seen.  Prominent collateral venous vasculature is noted anteriorly at the level of the pelvis.  No acute osseous abnormalities are identified. The patient is status post vertebroplasty at L5. There is chronic compression deformity involving vertebral bodies L1, L4 and  L5, with mild underlying facet disease.  IMPRESSION: 1. Mild soft tissue inflammation and trace fluid noted along the ascending colon. The underlying colon is grossly unremarkable. This may surround a large lobule of fat, and could reflect epiploic appendagitis or possibly a small omental infarct, though the fat itself does not demonstrate significant inflammation. 2. Trace fluid within the pelvis may arise from the right mid abdominal process. 3. Nonspecific hypodensities within the inferior tip of the liver. Relatively stable 2.9 cm cyst noted at the hepatic dome. 4. Mild focal left renal scarring. Mild left-sided renal pelvicaliectasis remains within normal limits, without evidence of hydronephrosis. 5. Prominent venous collateral vasculature noted anteriorly at the level of the pelvis.   Electronically Signed   By: Roanna RaiderJeffery  Chang M.D.   On: 02/05/2015 22:36     EKG Interpretation None      MDM   Final diagnoses:  Colitis  Epiploic appendagitis    8:25 PM Labs show elevated WBC at 14. Vitals stable and patient afebrile.   12:53 AM CT shows soft tissue inflammation and fluid along the ascending colon and epiploic appendigitis. Patient feeling better after pain medication and zofran. Patient will be discharged with PCP follow up. Vitals stable and patient afebrile.    Emilia BeckKaitlyn Aqueelah Cotrell, PA-C 02/06/15 1508  Elwin MochaBlair Walden, MD 02/07/15 [phone]

## 2015-02-05 NOTE — ED Notes (Signed)
Pt's contact:  Moishe SpiceDawn Whitley (neighbor and friend)---- tel# [phone]

## 2015-02-05 NOTE — ED Notes (Signed)
Bed: [id number] Expected date:  Expected time:  Means of arrival:  Comments: Hold T1

## 2015-02-06 MED ORDER — OXYCODONE-ACETAMINOPHEN 5-325 MG PO TABS: 1.0000 | ORAL_TABLET | ORAL | Status: DC | PRN

## 2015-02-06 MED ORDER — ONDANSETRON 4 MG PO TBDP: 4.0000 mg | ORAL_TABLET | Freq: Three times a day (TID) | ORAL | Status: DC | PRN

## 2015-02-06 NOTE — Discharge Instructions (Signed)
Take Percocet as needed for pain. Take zofran as needed for nausea. Refer to attached documents for more information.  °

## 2015-02-06 NOTE — ED Notes (Signed)
Attempts (3x) to call pt's friend, Alvis LemmingsDawn, has been unsuccessful.

## 2015-02-06 NOTE — ED Notes (Signed)
Pt's brother, Maisie Fushomas, will come and pick up patient.

## 2015-02-08 DIAGNOSIS — F331 Major depressive disorder, recurrent, moderate: Secondary | ICD-10-CM | POA: Diagnosis not present

## 2015-02-08 DIAGNOSIS — F419 Anxiety disorder, unspecified: Secondary | ICD-10-CM | POA: Diagnosis not present

## 2015-02-11 DIAGNOSIS — G542 Cervical root disorders, not elsewhere classified: Secondary | ICD-10-CM | POA: Diagnosis not present

## 2015-02-11 DIAGNOSIS — G629 Polyneuropathy, unspecified: Secondary | ICD-10-CM | POA: Diagnosis not present

## 2015-02-16 ENCOUNTER — Other Ambulatory Visit: Payer: Self-pay | Admitting: Neurosurgery

## 2015-02-17 ENCOUNTER — Encounter (HOSPITAL_COMMUNITY)
Admission: RE | Admit: 2015-02-17 | Discharge: 2015-02-17 | Disposition: A | Discharge status: Home / Self Care | Payer: Medicare Other | Source: Ambulatory Visit | Attending: Neurosurgery | Admitting: Neurosurgery

## 2015-02-17 ENCOUNTER — Encounter (HOSPITAL_COMMUNITY): Payer: Self-pay

## 2015-02-17 DIAGNOSIS — Z01812 Encounter for preprocedural laboratory examination: Secondary | ICD-10-CM | POA: Diagnosis not present

## 2015-02-17 DIAGNOSIS — M542 Cervicalgia: Secondary | ICD-10-CM | POA: Diagnosis not present

## 2015-02-17 HISTORY — DX: Headache, unspecified: R51.9

## 2015-02-17 HISTORY — DX: Noninfective gastroenteritis and colitis, unspecified: K52.9

## 2015-02-17 HISTORY — DX: Headache: R51

## 2015-02-17 LAB — CBC
HCT: 37.2 % (ref 36.0–46.0)
HEMOGLOBIN: 12 g/dL (ref 12.0–15.0)
MCH: 29.7 pg (ref 26.0–34.0)
MCHC: 32.3 g/dL (ref 30.0–36.0)
MCV: 92.1 fL (ref 78.0–100.0)
Platelets: 273 10*3/uL (ref 150–400)
RBC: 4.04 MIL/uL (ref 3.87–5.11)
RDW: 13.9 % (ref 11.5–15.5)
WBC: 7.5 10*3/uL (ref 4.0–10.5)

## 2015-02-17 LAB — BASIC METABOLIC PANEL
Anion gap: 7 (ref 5–15)
BUN: <5 mg/dL — ABNORMAL LOW (ref 6–20)
CHLORIDE: 103 mmol/L (ref 101–111)
CO2: 29 mmol/L (ref 22–32)
Calcium: 9.3 mg/dL (ref 8.9–10.3)
Creatinine, Ser: 0.83 mg/dL (ref 0.44–1.00)
GFR calc Af Amer: >60 mL/min (ref >60)
GFR calc non Af Amer: >60 mL/min (ref >60)
Glucose, Bld: 102 mg/dL — ABNORMAL HIGH (ref 65–99)
Potassium: 4.1 mmol/L (ref 3.5–5.1)
SODIUM: 139 mmol/L (ref 135–145)

## 2015-02-17 LAB — SURGICAL PCR SCREEN
MRSA, PCR: NEGATIVE
Staphylococcus aureus: NEGATIVE

## 2015-02-17 NOTE — Progress Notes (Signed)
Pt denies SOB, chest pain, and being under the care of a cardiologist. Pt denies having a chest x ray within the last year. Pt stated that she had a stress test and echo  more than 10 years ago but denies having a cardiac cath. Pt [place], [place] ( [place]) to review EKG.

## 2015-02-17 NOTE — Pre-Procedure Instructions (Addendum)
Elizabeth [name]  02/17/2015      [id number] - [place], [place] - [address] [place] [address] Phone: [phone] Fax: [phone]  [id number] - [place], [place] - [address] AT CORNER OF [facility] [address] [place] [address] Phone: [phone] Fax: [phone]    Your procedure is scheduled on  Wednesday, February 24, 2015  Report to [facility] Admitting at 10:45 A.M.( per MD)  Call this number if you have problems the morning of surgery:  [phone]   Remember:  Do not eat food or drink liquids after midnight Tuesday, Feb 23, 2015  Take these medicines the morning of surgery with A OF WATER:levothyroxine (SYNTHROID), citalopram (CELEXA), verapamil (CALAN) if needed: diazepam (VALIUM) for anxiety, oxyCODONE-acetaminophen (PERCOCET/ROXICET) for pain, albuterol (PROVENTIL)  nebulizer solution for wheezing or shortness of breath  Stop taking Aspirin, vitamins and herbal medications. Do not take any NSAIDs ie: Ibuprofen, Advil, Naproxen or any medication containing Aspirin; stop now.   Do not wear jewelry, make-up or nail polish.  Do not wear lotions, powders, or perfumes.  You may not wear deodorant.  Do not shave 48 hours prior to surgery.    Do not bring valuables to the hospital.  [facility] is not responsible for any belongings or valuables.  Contacts, dentures or bridgework may not be worn into surgery.  Leave your suitcase in the car.  After surgery it may be brought to your room.  For patients admitted to the hospital, discharge time will be determined by your treatment team.  Patients discharged the day of surgery will not be allowed to drive home.   Name and phone number of your driver:    Special instructions:   Special Instructions:Special Instructions: [facility] - Preparing for Surgery  Before surgery, you can play an important role.  Because skin is not  sterile, your skin needs to be as free of germs as possible.  You can reduce the number of germs on you skin by washing with CHG (chlorahexidine gluconate) soap before surgery.  CHG is an antiseptic cleaner which kills germs and bonds with the skin to continue killing germs even after washing.  Please DO NOT use if you have an allergy to CHG or antibacterial soaps.  If your skin becomes reddened/irritated stop using the CHG and inform your nurse when you arrive at Short Stay.  Do not shave (including legs and underarms) for at least 48 hours prior to the first CHG shower.  You may shave your face.  Please follow these instructions carefully:   1.  Shower with CHG Soap the night before surgery and the morning of Surgery.  2.  If you choose to wash your hair, wash your hair first as usual with your normal shampoo.  3.  After you shampoo, rinse your hair and body thoroughly to remove the Shampoo.  4.  Use CHG as you would any other liquid soap.  You can apply chg directly  to the skin and wash gently with scrungie or a clean washcloth.  5.  Apply the CHG Soap to your body ONLY FROM THE NECK DOWN.  Do not use on open wounds or open sores.  Avoid contact with your eyes, ears, mouth and genitals (private parts).  Wash genitals (private parts) with your normal soap.  6.  Wash thoroughly, paying special attention to the area where your surgery will be performed.  7.  Thoroughly rinse your  body with warm water from the neck down.  8.  DO NOT shower/wash with your normal soap after using and rinsing off the CHG Soap.  9.  Pat yourself dry with a clean towel.            10.  Wear clean pajamas.            11.  Place clean sheets on your bed the night of your first shower and do not sleep with pets.  Day of Surgery  Do not apply any lotions/deodorants the morning of surgery.  Please wear clean clothes to the hospital/surgery center.  Please read over the following fact sheets that you were given. Pain  Booklet, Coughing and Deep Breathing, MRSA Information and Surgical Site Infection Prevention

## 2015-02-18 NOTE — Progress Notes (Signed)
Anesthesia Chart Review:  Patient is a 74 year old female scheduled for C4-5, C5-6, C6-7 ACDF on 02/24/15 by Dr. Jeral FruitBotero.  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 PCP is listed as Dr. Shirlean Mylararol Webb.  Meds include albuterol, Celexa, Flexeril, Valium, Synthroid, Lidoderm, Percocet, Zocor, verapamil.  10/06/14 EKG: SR at 95 bpm, PVCs, anteroseptal infarct (age undetermined). PVCs and Q wave in V2 are new when compared to 03/22/10 EKG.  Q waves in V1 are old.  She denied CP and SOB at PAT. She reported a remote history of a stress and echo (> 10 years ago).  Preoperative labs noted.   Reviewed above with anesthesiologist Dr. Noreene LarssonJoslin.  If she remains asymptomatic from a CV standpoint then it is anticipated that she can proceed as planned.  Velna Ochsllison Cortasia Screws, PA-C [facility]Anesthesiology Phone [phone] 02/18/2015 2:37 PM

## 2015-02-23 MED ORDER — CEFAZOLIN SODIUM-DEXTROSE 2-3 GM-% IV SOLR
2.0000 g | INTRAVENOUS | Status: AC
  Administered 2015-02-24: 2 g via INTRAVENOUS
  Filled 2015-02-23: qty 50

## 2015-02-23 NOTE — H&P (Signed)
Elizabeth [name] is an 74 y.o. female.   Chief Complaint: neck pain HPI: patient who came to my office complaining of neck pain with radiation to both arms,burning sensation in the hands, unable to sleep which gets worse with mobility of the neck.she is wearing hand splints but with no help. Also some lumbar pain where she had a traumatic fracture  Past Medical History  Diagnosis Date  . Pulmonary embolus   . Lyme disease   . Blood clot in vein   . RSD (reflex sympathetic dystrophy)   . Fibromyalgia   . PMR (polymyalgia rheumatica)   . Thyroid disease   . Hypertension   . Arthritis   . Asthma   . Headache     H/O migraines  . Colitis     appendecticitis    Past Surgical History  Procedure Laterality Date  . Cholecystectomy    . Back surgery    . Eye surgery      Lasik  . Ivc filter    . Thrombectomy / embolectomy vena cava    . Appendectomy    . Dilation and curettage of uterus      Family History  Problem Relation Age of Onset  . Cancer Daughter    Social History:  reports that she has never smoked. She has never used smokeless tobacco. She reports that she drinks alcohol. She reports that she uses illicit drugs (Oxycodone).  Allergies:  Allergies  Allergen Reactions  . Ivp Dye [Iodinated Diagnostic Agents] Anaphylaxis  . Gabapentin     confusion  . Codeine Palpitations    No prescriptions prior to admission    No results found for this or any previous visit (from the past 48 hour(s)). No results found.  Review of Systems  Constitutional: Negative.   Respiratory: Negative.   Cardiovascular: Negative.   Gastrointestinal: Negative.   Musculoskeletal: Positive for back pain and neck pain.  Skin: Negative.   Neurological: Positive for sensory change and focal weakness.  Endo/Heme/Allergies: Negative.   Psychiatric/Behavioral: Negative.     There were no vitals taken for this visit. Physical Exam hent, nl. Neck, pain with mobility. Lungs, clear. Cv, nl.  Abdomen, soft. Extremities nl. NEURO weakness of biceps and deltoids also thenar muscles. tinnel sign positive bilaterally. dtr 3 plus no babinski.mri shows stenosis from c4 to c7. ncv is positive por cts bilaterlly  Assessment/Plan Patient agrees with decompression and fusion from cervical 4 to 7. Also decompression of the right median nerve. She is aware of riks and benefits  Verdon Ferrante M 02/23/2015, 2:21 PM

## 2015-02-24 ENCOUNTER — Inpatient Hospital Stay (HOSPITAL_COMMUNITY): Payer: Medicare Other

## 2015-02-24 ENCOUNTER — Inpatient Hospital Stay (HOSPITAL_COMMUNITY): Payer: Medicare Other | Admitting: Vascular Surgery

## 2015-02-24 ENCOUNTER — Encounter (HOSPITAL_COMMUNITY): Payer: Self-pay | Admitting: *Deleted

## 2015-02-24 ENCOUNTER — Inpatient Hospital Stay (HOSPITAL_COMMUNITY)
Admission: RE | Admit: 2015-02-24 | Discharge: 2015-03-02 | DRG: 472 | Disposition: A | Discharge status: Home / Self Care | Payer: Medicare Other | Source: Other Acute Inpatient Hospital | Attending: Neurosurgery | Admitting: Neurosurgery

## 2015-02-24 ENCOUNTER — Encounter (HOSPITAL_COMMUNITY): Admission: RE | Disposition: A | Discharge status: Home / Self Care | Payer: Self-pay | Attending: Neurosurgery

## 2015-02-24 ENCOUNTER — Inpatient Hospital Stay (HOSPITAL_COMMUNITY): Payer: Medicare Other | Admitting: Anesthesiology

## 2015-02-24 DIAGNOSIS — K219 Gastro-esophageal reflux disease without esophagitis: Secondary | ICD-10-CM | POA: Diagnosis present

## 2015-02-24 DIAGNOSIS — E785 Hyperlipidemia, unspecified: Secondary | ICD-10-CM | POA: Diagnosis present

## 2015-02-24 DIAGNOSIS — I10 Essential (primary) hypertension: Secondary | ICD-10-CM | POA: Diagnosis present

## 2015-02-24 DIAGNOSIS — Z86711 Personal history of pulmonary embolism: Secondary | ICD-10-CM

## 2015-02-24 DIAGNOSIS — G9050 Complex regional pain syndrome I, unspecified: Secondary | ICD-10-CM | POA: Diagnosis present

## 2015-02-24 DIAGNOSIS — Z419 Encounter for procedure for purposes other than remedying health state, unspecified: Secondary | ICD-10-CM

## 2015-02-24 DIAGNOSIS — M542 Cervicalgia: Secondary | ICD-10-CM | POA: Diagnosis not present

## 2015-02-24 DIAGNOSIS — F329 Major depressive disorder, single episode, unspecified: Secondary | ICD-10-CM | POA: Diagnosis present

## 2015-02-24 DIAGNOSIS — M5010 Cervical disc disorder with radiculopathy, unspecified cervical region: Secondary | ICD-10-CM | POA: Diagnosis not present

## 2015-02-24 DIAGNOSIS — M5412 Radiculopathy, cervical region: Secondary | ICD-10-CM | POA: Diagnosis present

## 2015-02-24 DIAGNOSIS — Z885 Allergy status to narcotic agent status: Secondary | ICD-10-CM | POA: Diagnosis not present

## 2015-02-24 DIAGNOSIS — I739 Peripheral vascular disease, unspecified: Secondary | ICD-10-CM | POA: Diagnosis present

## 2015-02-24 DIAGNOSIS — E039 Hypothyroidism, unspecified: Secondary | ICD-10-CM | POA: Diagnosis not present

## 2015-02-24 DIAGNOSIS — E079 Disorder of thyroid, unspecified: Secondary | ICD-10-CM | POA: Diagnosis present

## 2015-02-24 DIAGNOSIS — M4322 Fusion of spine, cervical region: Secondary | ICD-10-CM | POA: Diagnosis not present

## 2015-02-24 DIAGNOSIS — Z91041 Radiographic dye allergy status: Secondary | ICD-10-CM | POA: Diagnosis not present

## 2015-02-24 DIAGNOSIS — Z888 Allergy status to other drugs, medicaments and biological substances status: Secondary | ICD-10-CM

## 2015-02-24 DIAGNOSIS — G5601 Carpal tunnel syndrome, right upper limb: Secondary | ICD-10-CM | POA: Diagnosis not present

## 2015-02-24 DIAGNOSIS — G5602 Carpal tunnel syndrome, left upper limb: Secondary | ICD-10-CM | POA: Diagnosis present

## 2015-02-24 DIAGNOSIS — M1990 Unspecified osteoarthritis, unspecified site: Secondary | ICD-10-CM | POA: Diagnosis present

## 2015-02-24 DIAGNOSIS — J45909 Unspecified asthma, uncomplicated: Secondary | ICD-10-CM | POA: Diagnosis present

## 2015-02-24 DIAGNOSIS — M4802 Spinal stenosis, cervical region: Principal | ICD-10-CM | POA: Diagnosis present

## 2015-02-24 DIAGNOSIS — M353 Polymyalgia rheumatica: Secondary | ICD-10-CM | POA: Diagnosis present

## 2015-02-24 DIAGNOSIS — M797 Fibromyalgia: Secondary | ICD-10-CM | POA: Diagnosis not present

## 2015-02-24 HISTORY — PX: ANTERIOR CERVICAL DECOMP/DISCECTOMY FUSION: SHX1161

## 2015-02-24 HISTORY — PX: CARPAL TUNNEL RELEASE: SHX101

## 2015-02-24 SURGERY — ANTERIOR CERVICAL DECOMPRESSION/DISCECTOMY FUSION 3 LEVELS
Anesthesia: General | Laterality: Right

## 2015-02-24 MED ORDER — ROCURONIUM BROMIDE 100 MG/10ML IV SOLN
INTRAVENOUS | Status: DC | PRN
  Administered 2015-02-24: 50 mg via INTRAVENOUS

## 2015-02-24 MED ORDER — LIDOCAINE HCL (CARDIAC) 20 MG/ML IV SOLN
INTRAVENOUS | Status: AC
  Filled 2015-02-24: qty 5

## 2015-02-24 MED ORDER — SODIUM CHLORIDE 0.9 % IV SOLN
INTRAVENOUS | Status: DC
  Administered 2015-02-24: 16:00:00 via INTRAVENOUS
  Administered 2015-02-27: 75 mL/h via INTRAVENOUS

## 2015-02-24 MED ORDER — ONDANSETRON 4 MG PO TBDP: 4.0000 mg | ORAL_TABLET | Freq: Three times a day (TID) | ORAL | Status: DC | PRN

## 2015-02-24 MED ORDER — ACETAMINOPHEN 10 MG/ML IV SOLN
INTRAVENOUS | Status: AC
  Filled 2015-02-24: qty 100

## 2015-02-24 MED ORDER — HYDROMORPHONE HCL 1 MG/ML IJ SOLN
0.2500 mg | INTRAMUSCULAR | Status: DC | PRN
  Administered 2015-02-24 (×4): 0.25 mg via INTRAVENOUS

## 2015-02-24 MED ORDER — CEFAZOLIN SODIUM 1-5 GM-% IV SOLN
1.0000 g | Freq: Three times a day (TID) | INTRAVENOUS | Status: AC
  Administered 2015-02-24 – 2015-02-25 (×2): 1 g via INTRAVENOUS
  Filled 2015-02-24 (×2): qty 50

## 2015-02-24 MED ORDER — HYDROMORPHONE HCL 1 MG/ML IJ SOLN
2.0000 mg | INTRAMUSCULAR | Status: DC | PRN
  Administered 2015-02-25 (×4): 2 mg via INTRAMUSCULAR
  Filled 2015-02-24 (×6): qty 2

## 2015-02-24 MED ORDER — NEOSTIGMINE METHYLSULFATE 10 MG/10ML IV SOLN
INTRAVENOUS | Status: AC
  Filled 2015-02-24: qty 1

## 2015-02-24 MED ORDER — DEXAMETHASONE SODIUM PHOSPHATE 4 MG/ML IJ SOLN
INTRAMUSCULAR | Status: DC | PRN
  Administered 2015-02-24: 4 mg via INTRAVENOUS

## 2015-02-24 MED ORDER — PHENOL 1.4 % MT LIQD: 1.0000 | OROMUCOSAL | Status: DC | PRN

## 2015-02-24 MED ORDER — MIDAZOLAM HCL 5 MG/5ML IJ SOLN
INTRAMUSCULAR | Status: DC | PRN
  Administered 2015-02-24: 2 mg via INTRAVENOUS

## 2015-02-24 MED ORDER — MORPHINE SULFATE 2 MG/ML IJ SOLN
1.0000 mg | INTRAMUSCULAR | Status: DC | PRN
  Administered 2015-02-24 (×2): 2 mg via INTRAVENOUS
  Filled 2015-02-24 (×2): qty 1

## 2015-02-24 MED ORDER — DEXMEDETOMIDINE HCL 200 MCG/2ML IV SOLN
INTRAVENOUS | Status: DC | PRN
  Administered 2015-02-24: 8 ug via INTRAVENOUS
  Administered 2015-02-24: 4 ug via INTRAVENOUS
  Administered 2015-02-24: 8 ug via INTRAVENOUS

## 2015-02-24 MED ORDER — OXYCODONE-ACETAMINOPHEN 5-325 MG PO TABS
1.0000 | ORAL_TABLET | ORAL | Status: DC | PRN
  Administered 2015-02-24 – 2015-02-25 (×2): 2 via ORAL
  Administered 2015-02-25 (×2): 1 via ORAL
  Administered 2015-02-25 – 2015-03-02 (×20): 2 via ORAL
  Filled 2015-02-24: qty 1
  Filled 2015-02-24 (×16): qty 2
  Filled 2015-02-24: qty 1
  Filled 2015-02-24 (×8): qty 2

## 2015-02-24 MED ORDER — DIAZEPAM 5 MG PO TABS
5.0000 mg | ORAL_TABLET | Freq: Four times a day (QID) | ORAL | Status: DC | PRN
  Administered 2015-02-24 – 2015-03-02 (×10): 5 mg via ORAL
  Filled 2015-02-24 (×10): qty 1

## 2015-02-24 MED ORDER — HYDROMORPHONE HCL 1 MG/ML IJ SOLN
INTRAMUSCULAR | Status: AC
  Filled 2015-02-24: qty 1

## 2015-02-24 MED ORDER — SODIUM CHLORIDE 0.9 % IJ SOLN: 3.0000 mL | INTRAMUSCULAR | Status: DC | PRN

## 2015-02-24 MED ORDER — VERAPAMIL HCL 80 MG PO TABS
80.0000 mg | ORAL_TABLET | Freq: Two times a day (BID) | ORAL | Status: DC
  Administered 2015-02-24 – 2015-03-02 (×12): 80 mg via ORAL
  Filled 2015-02-24 (×14): qty 1

## 2015-02-24 MED ORDER — DULOXETINE HCL 20 MG PO CPEP
20.0000 mg | ORAL_CAPSULE | Freq: Two times a day (BID) | ORAL | Status: DC
  Administered 2015-02-25 – 2015-03-02 (×8): 20 mg via ORAL
  Filled 2015-02-24 (×14): qty 1

## 2015-02-24 MED ORDER — BACITRACIN ZINC 500 UNIT/GM EX OINT
TOPICAL_OINTMENT | CUTANEOUS | Status: DC | PRN
  Administered 2015-02-24: 1 via TOPICAL

## 2015-02-24 MED ORDER — ACETAMINOPHEN 650 MG RE SUPP: 650.0000 mg | RECTAL | Status: DC | PRN

## 2015-02-24 MED ORDER — EPHEDRINE SULFATE 50 MG/ML IJ SOLN
INTRAMUSCULAR | Status: DC | PRN
  Administered 2015-02-24: 5 mg via INTRAVENOUS
  Administered 2015-02-24: 10 mg via INTRAVENOUS

## 2015-02-24 MED ORDER — ALBUTEROL SULFATE (2.5 MG/3ML) 0.083% IN NEBU
2.5000 mg | INHALATION_SOLUTION | Freq: Four times a day (QID) | RESPIRATORY_TRACT | Status: DC | PRN
  Administered 2015-02-25: 2.5 mg via RESPIRATORY_TRACT
  Filled 2015-02-24: qty 3

## 2015-02-24 MED ORDER — PHENYLEPHRINE HCL 10 MG/ML IJ SOLN
INTRAMUSCULAR | Status: DC | PRN
  Administered 2015-02-24 (×4): 40 ug via INTRAVENOUS
  Administered 2015-02-24: 80 ug via INTRAVENOUS

## 2015-02-24 MED ORDER — MENTHOL 3 MG MT LOZG: 1.0000 | LOZENGE | OROMUCOSAL | Status: DC | PRN

## 2015-02-24 MED ORDER — DEXAMETHASONE 4 MG PO TABS
4.0000 mg | ORAL_TABLET | Freq: Four times a day (QID) | ORAL | Status: DC
  Administered 2015-02-25 – 2015-03-02 (×19): 4 mg via ORAL
  Filled 2015-02-24 (×20): qty 1

## 2015-02-24 MED ORDER — FENTANYL CITRATE (PF) 100 MCG/2ML IJ SOLN
INTRAMUSCULAR | Status: DC | PRN
  Administered 2015-02-24: 50 ug via INTRAVENOUS
  Administered 2015-02-24 (×2): 100 ug via INTRAVENOUS
  Administered 2015-02-24 (×2): 50 ug via INTRAVENOUS
  Administered 2015-02-24: 100 ug via INTRAVENOUS
  Administered 2015-02-24: 50 ug via INTRAVENOUS

## 2015-02-24 MED ORDER — FENTANYL CITRATE (PF) 250 MCG/5ML IJ SOLN
INTRAMUSCULAR | Status: AC
  Filled 2015-02-24: qty 5

## 2015-02-24 MED ORDER — MIDAZOLAM HCL 2 MG/2ML IJ SOLN
INTRAMUSCULAR | Status: AC
  Filled 2015-02-24: qty 2

## 2015-02-24 MED ORDER — PROMETHAZINE HCL 25 MG/ML IJ SOLN: 6.2500 mg | INTRAMUSCULAR | Status: DC | PRN

## 2015-02-24 MED ORDER — DEXAMETHASONE SODIUM PHOSPHATE 4 MG/ML IJ SOLN
4.0000 mg | Freq: Four times a day (QID) | INTRAMUSCULAR | Status: DC
  Administered 2015-02-24 – 2015-02-27 (×6): 4 mg via INTRAVENOUS
  Filled 2015-02-24 (×6): qty 1

## 2015-02-24 MED ORDER — SODIUM CHLORIDE 0.9 % IJ SOLN
3.0000 mL | Freq: Two times a day (BID) | INTRAMUSCULAR | Status: DC
  Administered 2015-02-24 – 2015-03-01 (×5): 3 mL via INTRAVENOUS

## 2015-02-24 MED ORDER — ACETAMINOPHEN 325 MG PO TABS
650.0000 mg | ORAL_TABLET | ORAL | Status: DC | PRN
  Administered 2015-03-01: 650 mg via ORAL
  Filled 2015-02-24: qty 2

## 2015-02-24 MED ORDER — LACTATED RINGERS IV SOLN
INTRAVENOUS | Status: DC
  Administered 2015-02-24 (×3): via INTRAVENOUS

## 2015-02-24 MED ORDER — ONDANSETRON HCL 4 MG/2ML IJ SOLN
INTRAMUSCULAR | Status: AC
  Filled 2015-02-24: qty 2

## 2015-02-24 MED ORDER — PROPOFOL 10 MG/ML IV BOLUS
INTRAVENOUS | Status: AC
  Filled 2015-02-24: qty 20

## 2015-02-24 MED ORDER — ONDANSETRON HCL 4 MG/2ML IJ SOLN
4.0000 mg | INTRAMUSCULAR | Status: DC | PRN
Start: 2015-02-24 — End: 2015-03-02

## 2015-02-24 MED ORDER — GLYCOPYRROLATE 0.2 MG/ML IJ SOLN
INTRAMUSCULAR | Status: DC | PRN
  Administered 2015-02-24: 0.6 mg via INTRAVENOUS

## 2015-02-24 MED ORDER — 0.9 % SODIUM CHLORIDE (POUR BTL) OPTIME
TOPICAL | Status: DC | PRN
  Administered 2015-02-24: 1000 mL

## 2015-02-24 MED ORDER — MEPERIDINE HCL 25 MG/ML IJ SOLN: 6.2500 mg | INTRAMUSCULAR | Status: DC | PRN

## 2015-02-24 MED ORDER — GLYCOPYRROLATE 0.2 MG/ML IJ SOLN
INTRAMUSCULAR | Status: AC
  Filled 2015-02-24: qty 3

## 2015-02-24 MED ORDER — PROPOFOL 10 MG/ML IV BOLUS
INTRAVENOUS | Status: DC | PRN
  Administered 2015-02-24: 150 mg via INTRAVENOUS
  Administered 2015-02-24: 30 mg via INTRAVENOUS

## 2015-02-24 MED ORDER — SIMVASTATIN 10 MG PO TABS
10.0000 mg | ORAL_TABLET | Freq: Every day | ORAL | Status: DC
  Administered 2015-02-24 – 2015-03-01 (×6): 10 mg via ORAL
  Filled 2015-02-24 (×8): qty 1

## 2015-02-24 MED ORDER — ROCURONIUM BROMIDE 50 MG/5ML IV SOLN
INTRAVENOUS | Status: AC
  Filled 2015-02-24: qty 1

## 2015-02-24 MED ORDER — SODIUM CHLORIDE 0.9 % IV SOLN: 250.0000 mL | INTRAVENOUS | Status: DC

## 2015-02-24 MED ORDER — ONDANSETRON HCL 4 MG/2ML IJ SOLN
INTRAMUSCULAR | Status: DC | PRN
  Administered 2015-02-24: 4 mg via INTRAVENOUS

## 2015-02-24 MED ORDER — BUPIVACAINE-EPINEPHRINE 0.5% -1:200000 IJ SOLN
INTRAMUSCULAR | Status: DC | PRN
  Administered 2015-02-24: 4 mL

## 2015-02-24 MED ORDER — THROMBIN 5000 UNITS EX SOLR
OROMUCOSAL | Status: DC | PRN
  Administered 2015-02-24: 13:00:00 via TOPICAL

## 2015-02-24 MED ORDER — THROMBIN 20000 UNITS EX SOLR
CUTANEOUS | Status: DC | PRN
  Administered 2015-02-24: 13:00:00 via TOPICAL

## 2015-02-24 MED ORDER — ACETAMINOPHEN 10 MG/ML IV SOLN
1000.0000 mg | Freq: Once | INTRAVENOUS | Status: AC | PRN
  Administered 2015-02-24: 1000 mg via INTRAVENOUS

## 2015-02-24 MED ORDER — LEVOTHYROXINE SODIUM 75 MCG PO TABS
75.0000 ug | ORAL_TABLET | Freq: Every day | ORAL | Status: DC
  Administered 2015-02-25 – 2015-03-02 (×6): 75 ug via ORAL
  Filled 2015-02-24: qty 1
  Filled 2015-02-24 (×2): qty 3
  Filled 2015-02-24 (×2): qty 1
  Filled 2015-02-24: qty 3
  Filled 2015-02-24 (×3): qty 1
  Filled 2015-02-24 (×2): qty 3
  Filled 2015-02-24: qty 1
  Filled 2015-02-24: qty 3

## 2015-02-24 MED ORDER — CITALOPRAM HYDROBROMIDE 20 MG PO TABS
20.0000 mg | ORAL_TABLET | Freq: Every day | ORAL | Status: DC
  Administered 2015-02-25 – 2015-03-02 (×6): 20 mg via ORAL
  Filled 2015-02-24 (×3): qty 1
  Filled 2015-02-24: qty 2
  Filled 2015-02-24 (×2): qty 1
  Filled 2015-02-24 (×5): qty 2
  Filled 2015-02-24: qty 1

## 2015-02-24 MED ORDER — NEOSTIGMINE METHYLSULFATE 10 MG/10ML IV SOLN
INTRAVENOUS | Status: DC | PRN
  Administered 2015-02-24: 4 mg via INTRAVENOUS

## 2015-02-24 MED ORDER — LIDOCAINE HCL (CARDIAC) 20 MG/ML IV SOLN
INTRAVENOUS | Status: DC | PRN
  Administered 2015-02-24: 50 mg via INTRAVENOUS

## 2015-02-24 SURGICAL SUPPLY — 89 items
BENZOIN TINCTURE PRP APPL 2/3 (GAUZE/BANDAGES/DRESSINGS) ×4 IMPLANT
BIT DRILL SM SPINE QC 12 (BIT) ×4 IMPLANT
BLADE SURG 15 STRL LF DISP TIS (BLADE) ×2 IMPLANT
BLADE SURG 15 STRL SS (BLADE) ×2
BLADE ULTRA TIP 2M (BLADE) ×4 IMPLANT
BNDG GAUZE ELAST 4 BULKY (GAUZE/BANDAGES/DRESSINGS) ×12 IMPLANT
BUR BARREL STRAIGHT FLUTE 4.0 (BURR) IMPLANT
BUR MATCHSTICK NEURO 3.0 LAGG (BURR) ×4 IMPLANT
CANISTER SUCT 3000ML PPV (MISCELLANEOUS) ×4 IMPLANT
CLOSURE WOUND 1/2 X4 (GAUZE/BANDAGES/DRESSINGS) ×1
CONT SPEC 4OZ CLIKSEAL STRL BL (MISCELLANEOUS) ×4 IMPLANT
CORDS BIPOLAR (ELECTRODE) ×4 IMPLANT
COVER MAYO STAND STRL (DRAPES) ×4 IMPLANT
DECANTER SPIKE VIAL GLASS SM (MISCELLANEOUS) ×4 IMPLANT
DRAIN JACKSON PRATT 10MM FLAT (MISCELLANEOUS) ×4 IMPLANT
DRAPE C-ARM 42X72 X-RAY (DRAPES) IMPLANT
DRAPE EXTREMITY T 121X128X90 (DRAPE) ×4 IMPLANT
DRAPE LAPAROTOMY 100X72 PEDS (DRAPES) ×4 IMPLANT
DRAPE MICROSCOPE LEICA (MISCELLANEOUS) ×4 IMPLANT
DRAPE POUCH INSTRU U-SHP 10X18 (DRAPES) ×8 IMPLANT
DRAPE PROXIMA HALF (DRAPES) ×4 IMPLANT
DURAPREP 26ML APPLICATOR (WOUND CARE) ×4 IMPLANT
DURAPREP 6ML APPLICATOR 50/CS (WOUND CARE) ×4 IMPLANT
ELECT REM PT RETURN 9FT ADLT (ELECTROSURGICAL) ×4
ELECTRODE REM PT RTRN 9FT ADLT (ELECTROSURGICAL) ×2 IMPLANT
EVACUATOR SILICONE 100CC (DRAIN) ×4 IMPLANT
GAUZE SPONGE 4X4 12PLY STRL (GAUZE/BANDAGES/DRESSINGS) ×12 IMPLANT
GAUZE SPONGE 4X4 16PLY XRAY LF (GAUZE/BANDAGES/DRESSINGS) ×4 IMPLANT
GLOVE BIO SURGEON STRL SZ 6.5 (GLOVE) IMPLANT
GLOVE BIO SURGEON STRL SZ7 (GLOVE) IMPLANT
GLOVE BIO SURGEON STRL SZ7.5 (GLOVE) IMPLANT
GLOVE BIO SURGEON STRL SZ8 (GLOVE) IMPLANT
GLOVE BIO SURGEON STRL SZ8.5 (GLOVE) IMPLANT
GLOVE BIO SURGEONS STRL SZ 6.5 (GLOVE)
GLOVE BIOGEL M 8.0 STRL (GLOVE) ×8 IMPLANT
GLOVE BIOGEL PI IND STRL 7.5 (GLOVE) ×2 IMPLANT
GLOVE BIOGEL PI INDICATOR 7.5 (GLOVE) ×2
GLOVE ECLIPSE 6.5 STRL STRAW (GLOVE) IMPLANT
GLOVE ECLIPSE 7.0 STRL STRAW (GLOVE) IMPLANT
GLOVE ECLIPSE 7.5 STRL STRAW (GLOVE) IMPLANT
GLOVE ECLIPSE 8.0 STRL XLNG CF (GLOVE) IMPLANT
GLOVE ECLIPSE 8.5 STRL (GLOVE) IMPLANT
GLOVE EXAM NITRILE LRG STRL (GLOVE) IMPLANT
GLOVE EXAM NITRILE MD LF STRL (GLOVE) IMPLANT
GLOVE EXAM NITRILE XL STR (GLOVE) IMPLANT
GLOVE EXAM NITRILE XS STR PU (GLOVE) IMPLANT
GLOVE INDICATOR 6.5 STRL GRN (GLOVE) IMPLANT
GLOVE INDICATOR 7.0 STRL GRN (GLOVE) IMPLANT
GLOVE INDICATOR 7.5 STRL GRN (GLOVE) IMPLANT
GLOVE INDICATOR 8.0 STRL GRN (GLOVE) IMPLANT
GLOVE INDICATOR 8.5 STRL (GLOVE) IMPLANT
GLOVE OPTIFIT SS 8.0 STRL (GLOVE) IMPLANT
GLOVE SURG SS PI 6.5 STRL IVOR (GLOVE) IMPLANT
GLOVE SURG SS PI 7.0 STRL IVOR (GLOVE) ×16 IMPLANT
GOWN STRL REUS W/ TWL LRG LVL3 (GOWN DISPOSABLE) ×10 IMPLANT
GOWN STRL REUS W/ TWL XL LVL3 (GOWN DISPOSABLE) IMPLANT
GOWN STRL REUS W/TWL 2XL LVL3 (GOWN DISPOSABLE) IMPLANT
GOWN STRL REUS W/TWL LRG LVL3 (GOWN DISPOSABLE) ×10
GOWN STRL REUS W/TWL XL LVL3 (GOWN DISPOSABLE)
HALTER HD/CHIN CERV TRACTION D (MISCELLANEOUS) ×4 IMPLANT
HEMOSTAT POWDER KIT SURGIFOAM (HEMOSTASIS) IMPLANT
HEMOSTAT POWDER SURGIFOAM 1G (HEMOSTASIS) ×4 IMPLANT
KIT BASIN OR (CUSTOM PROCEDURE TRAY) ×4 IMPLANT
KIT ROOM TURNOVER OR (KITS) ×4 IMPLANT
NEEDLE HYPO 25X1 1.5 SAFETY (NEEDLE) ×4 IMPLANT
NEEDLE SPNL 22GX3.5 QUINCKE BK (NEEDLE) ×4 IMPLANT
NS IRRIG 1000ML POUR BTL (IV SOLUTION) ×4 IMPLANT
PACK LAMINECTOMY NEURO (CUSTOM PROCEDURE TRAY) ×4 IMPLANT
PACK SURGICAL SETUP 50X90 (CUSTOM PROCEDURE TRAY) IMPLANT
PAD ARMBOARD 7.5X6 YLW CONV (MISCELLANEOUS) ×8 IMPLANT
PATTIES SURGICAL .5 X1 (DISPOSABLE) ×4 IMPLANT
PLATE ANT CERV XTEND 3 LV 45 (Plate) ×4 IMPLANT
RUBBERBAND STERILE (MISCELLANEOUS) ×8 IMPLANT
SCREW XTD VAR 4.2 SELF TAP 12 (Screw) ×28 IMPLANT
SCREW XTEND SELFTAP VAR 4.6X14 (Screw) ×4 IMPLANT
SPACER CERVICAL FRGE 12X14X7-7 (Spacer) ×12 IMPLANT
SPONGE INTESTINAL PEANUT (DISPOSABLE) ×8 IMPLANT
SPONGE SURGIFOAM ABS GEL 100 (HEMOSTASIS) ×4 IMPLANT
STRIP CLOSURE SKIN 1/2X4 (GAUZE/BANDAGES/DRESSINGS) ×3 IMPLANT
SUT ETHILON 3 0 PS 1 (SUTURE) ×4 IMPLANT
SUT VIC AB 3-0 SH 8-18 (SUTURE) ×8 IMPLANT
SYR 20ML ECCENTRIC (SYRINGE) ×4 IMPLANT
SYR BULB 3OZ (MISCELLANEOUS) IMPLANT
SYR CONTROL 10ML LL (SYRINGE) IMPLANT
TAPE CLOTH SURG 4X10 WHT LF (GAUZE/BANDAGES/DRESSINGS) ×8 IMPLANT
TOWEL OR 17X24 6PK STRL BLUE (TOWEL DISPOSABLE) ×8 IMPLANT
TOWEL OR 17X26 10 PK STRL BLUE (TOWEL DISPOSABLE) ×4 IMPLANT
TRAY FOLEY CATH 14FR (SET/KITS/TRAYS/PACK) ×4 IMPLANT
WATER STERILE IRR 1000ML POUR (IV SOLUTION) ×4 IMPLANT

## 2015-02-24 NOTE — Transfer of Care (Signed)
Immediate Anesthesia Transfer of Care Note  Patient: Elizabeth HenleFrances [name]  Procedure(s) Performed: Procedure(s) with comments: Cervical four-five cervical five-six cervical six- seven anterior cervical decompression/ diskectomy/ fusion (N/A) - C4-5 C5-6 C6-7 Anterior cervical decompression/diskectomy/fusion RIGHT CARPAL TUNNEL RELEASE (Right) - Right Carpal tunnel release  Patient Location: PACU  Anesthesia Type:General  Level of Consciousness: awake, alert , pateint uncooperative and confused  Airway & Oxygen Therapy: Patient Spontanous Breathing and Patient connected to nasal cannula oxygen  Post-op Assessment: Report given to RN, Post -op Vital signs reviewed and stable and Patient moving all extremities  Post vital signs: Reviewed and stable  Last Vitals:  Filed Vitals:   02/24/15 1514  BP: 160/76  Pulse: 112  Temp: 36.9 C  Resp: 20    Complications: No apparent anesthesia complications

## 2015-02-24 NOTE — Anesthesia Preprocedure Evaluation (Signed)
Anesthesia Evaluation  Patient identified by MRN, date of birth, ID band Patient awake    Reviewed: Allergy & Precautions, NPO status , Patient's Chart, lab work & pertinent test results  Airway Mallampati: II  TM Distance: >3 FB Neck ROM: Full    Dental no notable dental hx.    Pulmonary asthma , PE breath sounds clear to auscultation  Pulmonary exam normal       Cardiovascular hypertension, Pt. on medications + Peripheral Vascular Disease Normal cardiovascular examRhythm:Regular Rate:Normal     Neuro/Psych  Headaches, negative psych ROS   GI/Hepatic negative GI ROS, Neg liver ROS, GERD-  ,  Endo/Other  negative endocrine ROS  Renal/GU negative Renal ROS     Musculoskeletal  (+) Arthritis -, Fibromyalgia -  Abdominal   Peds  Hematology negative hematology ROS (+)   Anesthesia Other Findings   Reproductive/Obstetrics negative OB ROS                             Anesthesia Physical Anesthesia Plan  ASA: II  Anesthesia Plan: General   Post-op Pain Management:    Induction: Intravenous  Airway Management Planned: Oral ETT  Additional Equipment:   Intra-op Plan:   Post-operative Plan: Extubation in OR  Informed Consent: I have reviewed the patients History and Physical, chart, labs and discussed the procedure including the risks, benefits and alternatives for the proposed anesthesia with the patient or authorized representative who has indicated his/her understanding and acceptance.   Dental advisory given  Plan Discussed with: CRNA  Anesthesia Plan Comments:         Anesthesia Quick Evaluation

## 2015-02-24 NOTE — Anesthesia Postprocedure Evaluation (Signed)
  Anesthesia Post-op Note  Patient: Elizabeth [name]  Procedure(s) Performed: Procedure(s) with comments: Cervical four-five cervical five-six cervical six- seven anterior cervical decompression/ diskectomy/ fusion (N/A) - C4-5 C5-6 C6-7 Anterior cervical decompression/diskectomy/fusion RIGHT CARPAL TUNNEL RELEASE (Right) - Right Carpal tunnel release  Patient Location: PACU  Anesthesia Type: General   Level of Consciousness: awake, alert  and oriented  Airway and Oxygen Therapy: Patient Spontanous Breathing  Post-op Pain: mild  Post-op Assessment: Post-op Vital signs reviewed  Post-op Vital Signs: Reviewed  Last Vitals:  Filed Vitals:   02/24/15 1530  BP: 136/71  Pulse: 106  Temp:   Resp: 16    Complications: No apparent anesthesia complications

## 2015-02-24 NOTE — Anesthesia Procedure Notes (Signed)
Procedure Name: Intubation Date/Time: 02/24/2015 11:39 AM Performed by: Reine JustFLOWERS, Taylore Hinde T Pre-anesthesia Checklist: Patient identified, Emergency Drugs available, Suction available, Patient being monitored and Timeout performed Patient Re-evaluated:Patient Re-evaluated prior to inductionOxygen Delivery Method: Circle system utilized and Simple face mask Preoxygenation: Pre-oxygenation with 100% oxygen Intubation Type: IV induction Ventilation: Mask ventilation without difficulty Laryngoscope Size: Miller and 2 Grade View: Grade I Tube type: Oral Tube size: 7.5 mm Number of attempts: 1 Airway Equipment and Method: Patient positioned with wedge pillow and Stylet Placement Confirmation: ETT inserted through vocal cords under direct vision,  positive ETCO2 and breath sounds checked- equal and bilateral Secured at: 21 cm Tube secured with: Tape Dental Injury: Teeth and Oropharynx as per pre-operative assessment

## 2015-02-25 ENCOUNTER — Encounter (HOSPITAL_COMMUNITY): Payer: Self-pay | Admitting: Neurosurgery

## 2015-02-25 LAB — GLUCOSE, CAPILLARY: GLUCOSE-CAPILLARY: 293 mg/dL — AB (ref 65–99)

## 2015-02-25 NOTE — Care Management Utilization Note (Signed)
UR completed.    Frederic Tones Wise Desera Graffeo, RN, BSN [phone] 

## 2015-02-25 NOTE — Progress Notes (Signed)
Patient ID: Elizabeth HenleFrances [name], female   DOB: 10/20/1940, 74 y.o.   MRN: [id number] Pulled out her own drain. Weakness deltoid and biceps on the right. Triceps ok. lef tside normal. Pt to help. Spoke with her and brother

## 2015-02-25 NOTE — Op Note (Signed)
NAMCaleb Popp:  Tusing, Hellen              ACCOUNT NO.:  [id number]  MEDICAL RECORD NO.:  [id number]  LOCATION:  [id number]                        FACILITY:  [facility]  PHYSICIAN:  Hilda LiasErnesto Ridgely Anastacio, M.D.   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  Bilateral carpal tunnel syndrome, right worse than the left one.  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  Bilateral carpal tunnel syndrome, right worse than the left one.  PROCEDURES:  Anterior cervical 4-5, 5-6, and 6-7 decompression of spinal cord, bilateral foraminotomy, interbody fusion with allograft.  Plate from Z6-X0C4-C7.  Microscope.  Second stage decompression of the right median nerve.  SURGEON:  Hilda LiasErnesto Lanai Conlee, M.D.  ASSISTANT:  Hewitt Shortsobert W. Nudelman, M.D.  CLINICAL HISTORY:  Mrs. Werner LeanCentra was seen in my office because of neck pain radiation to both upper extremities.  Clinically, she had weakness of the deltoid and biceps.  MRI shows stenosis at the level 4-5, 5-6, and 6-7, and the nerve conduction test showed severe carpal tunnel syndrome, right worse than left one.  The patient wanted to proceed with surgery.  The patient knew the risks and benefits.  DESCRIPTION OF PROCEDURE:  The patient was taken to the OR, and after intubation, the left side of the neck was cleaned with DuraPrep and drapes were applied.  Transverse incision was made through the skin, subcutaneous tissue straight to the cervical spine.  X-rays showed that the needle was at level 4-5.  From then on with the help of the microscope, we entered the disk space at the level 4-5 first.  The disk was quite calcified and we had to do the drill to go all the way down to the posterior ligament.  Decompression of the cord was achieved.  We found  that the C5 nerve root to the right was full of fibrosis and more compromised than the one on the left.  At the end, we have good space of the spinal cord and the C5 nerve root.  The same procedure was followed at the level of 5-6, 6-7.  The patient at the level of 5-6 was worse than 6-7.  Nevertheless, at the end, we had good space for the cord at the level of 5-6, 6-7, and plenty of space for the nerve root at C6 and C7.  From then on, the endplates were drilled and 3 allograft, 7 mm height, lordotic were inserted at those 3 levels, followed by a plate using 8 screws.  X-ray showed good position of the plate and the graft. From then on, the area was irrigated and although we accomplished with good hemostasis.  We decided to leave a drain.  The wound was closed with different layer of Vicryl and Steri-Strips.  From then on, we started with the second procedure.  The right arm and hand were cleaned with DuraPrep and drapes were applied.  Then, a longitudinal incision along the base of the thumb was made through the skin, subcutaneous tissue, volar ligament and through  a thick calcified ligament.  Decompression medial and distal of the median nerve was achieved.  Hemostasis was done with bipolar.  The area was irrigated and the wound was closed with nylon.  From then on, the patient was extubated and transferred to the PACU.          ______________________________ Hilda Lias, M.D.     EB/MEDQ  D:  02/24/2015  T:  02/25/2015  Job:  [id number]

## 2015-02-25 NOTE — Progress Notes (Signed)
PT Cancellation Note  Patient Details Name: Elizabeth HenleFrances Espinal MRN: [id number] DOB: April 12, 1941   Cancelled Treatment:    Reason Eval/Treat Not Completed: Medical issues which prohibited therapy (patient extremely lethargic with poor responsiveness) Will hold for clearance from nsg.   Fabio AsaWerner, Pio Eatherly J 02/25/2015, 8:13 AM Charlotte Crumbevon Milayna Rotenberg, PT DPT  [phone]

## 2015-02-25 NOTE — Progress Notes (Signed)
Inpatient Diabetes Program Recommendations  AACE/ADA: New Consensus Statement on Inpatient Glycemic Control (2013)  Target Ranges:  Prepandial:   less than 140 mg/dL      Peak postprandial:   less than 180 mg/dL (1-2 hours)      Critically ill patients:  140 - 180 mg/dL   Results for Elizabeth HenleCENTRA, Elizabeth [name] (MRN [id number]) as of 02/25/2015 10:25  Ref. Range 02/25/2015 08:01  Glucose-Capillary Latest Ref Range: 65-99 mg/dL 478293 (H)   Reason for Visit: Decompression and fusion from cervical 4 to 7  Diabetes history: None Current orders for Inpatient glycemic control: None  Inpatient Diabetes Program Recommendations Correction (SSI): Due to decadron patient's glucose is nearly 300 mg/dl. Please consider ordering CBGs and Novolog sensitive scale 0-9 units ACHS.  Thanks,  Christena DeemShannon Saidee Geremia RN, MSN, [facility] Inpatient Diabetes Coordinator Team Pager [phone]

## 2015-02-25 NOTE — Evaluation (Signed)
Physical Therapy Evaluation Patient Details Name: Barrett HenleFrances Buscemi MRN: [id number] DOB: 01/20/1941 Today's Date: 02/25/2015   History of Present Illness  s/p anterior cervical decompression/discectomy fusion 3 levels and Second stage decompression of the right median nerve. PMH significant for RSD (reflex sympathetic dystrophy), fibromyalgia, thyroid disease, lyme disease, blood clot, PMR (polymyalgia rheumatica).  Clinical Impression  Patient demonstrates deficits in functional mobility as indicated below. Will need continued skilled PT to address deficits and maximize function. Will see as indicated and progress as tolerated. Will follow progression to ensure safe d/c.     Follow Up Recommendations Home health PT;Supervision/Assistance - 24 hour (may need to consider SNF if patient does not progress well)    Equipment Recommendations   (TBD awaiting WBing restrictions)    Recommendations for Other Services       Precautions / Restrictions Precautions Precautions: Cervical;Fall Precaution Comments: reviewed Required Braces or Orthoses: Cervical Brace Cervical Brace: Soft collar;Other (comment) (off for bathing per Dr. Jeral FruitBotero) Restrictions Weight Bearing Restrictions: No Other Position/Activity Restrictions: no WB order for Rt hand       Mobility  Bed Mobility Overal bed mobility: Needs Assistance Bed Mobility: Rolling;Sidelying to Sit;Sit to Sidelying Rolling: Supervision Sidelying to sit: Min assist     Sit to sidelying: Mod assist General bed mobility comments: Min assist to elevate trunk and manage lines to come to EOB moderate assist to return to beds for LE elevation back to bed. VCS for technique and sequencing with sidelying  Transfers Overall transfer level: Needs assistance Equipment used: 1 person hand held assist Transfers: Sit to/from UGI CorporationStand;Stand Pivot Transfers Sit to Stand: Min assist Stand pivot transfers: Min assist       General transfer comment: min  assist for stability in elevation to standing, assist for RUE support (NWBing for precaution)  Ambulation/Gait Ambulation/Gait assistance: Min assist Ambulation Distance (Feet): 6 Feet Assistive device: 1 person hand held assist       General Gait Details: steps to navigate and reposition towards HOB, 3 lateral in addition to forward steps (min assist for stability and RUE support)  Stairs            Wheelchair Mobility    Modified Rankin (Stroke Patients Only)       Balance Overall balance assessment: History of Falls Sitting-balance support: Single extremity supported Sitting balance-Leahy Scale: Fair     Standing balance support: Single extremity supported Standing balance-Leahy Scale: Poor Standing balance comment: needs assist for balance                             Pertinent Vitals/Pain Pain Assessment: 0-10 Pain Score: 3  Pain Location: neck Pain Descriptors / Indicators: Aching Pain Intervention(s): Monitored during session;Repositioned;Relaxation;Limited activity within patient's tolerance;Premedicated before session    Home Living Family/patient expects to be discharged to:: Private residence Living Arrangements: Alone Available Help at Discharge: Family;Friend(s);Available 24 hours/day Type of Home: Apartment Home Access: Level entry     Home Layout: One level Home Equipment: Cane - single point;Bedside commode;Grab bars - toilet;Grab bars - tub/shower      Prior Function Level of Independence: Independent               Hand Dominance   Dominant Hand: Right    Extremity/Trunk Assessment   Upper Extremity Assessment: RUE deficits/detail RUE Deficits / Details: s/p Second stage decompression of the right median nerve. Wrist wrapped in post op dressing. Shoulder and elbow flexors 1/5  MMT. RUE: Unable to fully assess due to immobilization       Lower Extremity Assessment: Generalized weakness         Communication    Communication: No difficulties  Cognition Arousal/Alertness: Awake/alert Behavior During Therapy: WFL for tasks assessed/performed Overall Cognitive Status: Within Functional Limits for tasks assessed                      General Comments General comments (skin integrity, edema, etc.): Pt off oxygen to complete ambulation from bed to chair with stats dropping to 85. O2 donned again in sitting with pt O2 recovering to 90 within 1 minute. Pt upset and crying at end of session  stating, " I don't like to disappoint people." Therapist provided reassurance. Pt with baseline limitations in Lt hip flexors.     Exercises Other Exercises Other Exercises: Educated on lap slides for shoulder flexion with pt completing 10. Needs further education on exercise for elbow flexors.      Assessment/Plan    PT Assessment Patient needs continued PT services  PT Diagnosis Difficulty walking;Abnormality of gait;Generalized weakness;Acute pain   PT Problem List Decreased strength;Decreased range of motion;Decreased activity tolerance;Decreased balance;Decreased mobility;Decreased coordination;Decreased safety awareness;Cardiopulmonary status limiting activity;Pain  PT Treatment Interventions DME instruction;Gait training;Stair training;Functional mobility training;Therapeutic activities;Therapeutic exercise;Balance training;Patient/family education   PT Goals (Current goals can be found in the Care Plan section) Acute Rehab PT Goals Patient Stated Goal: not stated. PT Goal Formulation: With patient Time For Goal Achievement: 03/11/15 Potential to Achieve Goals: Good    Frequency Min 5X/week   Barriers to discharge Decreased caregiver support      Co-evaluation               End of Session Equipment Utilized During Treatment: Cervical collar;Oxygen Activity Tolerance: No increased pain;Other (comment) (unable to void, assisted back to bed for in and out cath ) Patient left: in bed;with  call bell/phone within reach;with nursing/sitter in room Nurse Communication: Mobility status;Precautions         Time: 1610-9604 PT Time Calculation (min) (ACUTE ONLY): 19 min   Charges:   PT Evaluation $Initial PT Evaluation Tier I: 1 Procedure     PT G CodesFabio Asa 18-Mar-2015, 3:28 PM Charlotte Crumb, PT DPT  [phone]

## 2015-02-25 NOTE — Progress Notes (Signed)
Occupational Therapy Evaluation Patient Details Name: Elizabeth [name] MRN: [id number] DOB: 10-17-1940 Today's Date: 02/25/2015    History of Present Illness s/p anterior cervical decompression/discectomy fusion 3 levels and Second stage decompression of the right median nerve. PMH significant for RSD (reflex sympathetic dystrophy), fibromyalgia, thyroid disease, lyme disease, blood clot, PMR (polymyalgia rheumatica).   Clinical Impression   Pt admitted with the above diagnoses and presents with below problem list. Pt will benefit from continued OT to address the below listed deficits and maximize independence with BADLs prior to d/c. PTA pt was independent with ADLs. Pt is currently min-mod A for ADLs. Pt limited by pain this session and unable to maintain sitting up in recliner due to increased pain. ADLs, exercises, and education provided as detailed below. OT to continue to follow acutely.      Follow Up Recommendations  Supervision/Assistance - 24 hour;Home health OT;Other (comment) (may need to consider SNF depending on progress.)    Equipment Recommendations  None recommended by OT    Recommendations for Other Services PT consult     Precautions / Restrictions Precautions Precautions: Cervical;Fall Precaution Comments: reviewed Required Braces or Orthoses: Cervical Brace Cervical Brace: Soft collar;Other (comment) (off for bathing per Dr. Jeral Fruit) Restrictions Weight Bearing Restrictions: No (none on file) Other Position/Activity Restrictions:        Mobility Bed Mobility Overal bed mobility: Needs Assistance Bed Mobility: Rolling;Sidelying to Sit;Sit to Sidelying Rolling: Min assist Sidelying to sit: Mod assist     Sit to sidelying: Mod assist General bed mobility comments: Pt limited by neck pain and weakness in RUE. Assist to manage RUE and to advance trunk while maintaining log roll alignment.  Transfers Overall transfer level: Needs assistance Equipment used: 1  person hand held assist Transfers: Sit to/from Stand Sit to Stand: Min assist         General transfer comment: from elevated bed height and from recliner. Assist for balance and powerup.    Balance Overall balance assessment: Needs assistance Sitting-balance support: Single extremity supported;Feet supported Sitting balance-Leahy Scale: Fair     Standing balance support: Single extremity supported;During functional activity Standing balance-Leahy Scale: Poor Standing balance comment: needs assist for balance                            ADL Overall ADL's : Needs assistance/impaired Eating/Feeding: Moderate assistance;Sitting   Grooming: Moderate assistance;Sitting   Upper Body Bathing: Moderate assistance;Sitting   Lower Body Bathing: Moderate assistance;Sit to/from stand   Upper Body Dressing : Moderate assistance;Sitting   Lower Body Dressing: Moderate assistance;Sit to/from stand   Toilet Transfer: Minimal assistance;Ambulation;BSC   Toileting- Architect and Hygiene: Sit to/from stand;Minimal assistance   Tub/ Shower Transfer: Minimal assistance;Ambulation;Shower seat   Functional mobility during ADLs: Minimal assistance General ADL Comments: Pt completed logrolling to EOB. Pt then walked around bed to access recliner with min A +1 HHA. Pt transfered to recliner but was unable to maintain sitting in recliner more than 5 minutes due to increased nack and back pain. Pt requested to return to bed and was upset, crying. Repositioned in bed, notifed nurse of pain med request.      Vision     Perception     Praxis      Pertinent Vitals/Pain Pain Assessment: 0-10 Pain Score: 8  Pain Location: neck Pain Descriptors / Indicators: Aching;Crying;Grimacing;Moaning;Operative site guarding Pain Intervention(s): Limited activity within patient's tolerance;Monitored during session;Repositioned;Patient requesting pain meds-RN notified;Utilized  relaxation techniques     Hand Dominance Right   Extremity/Trunk Assessment Upper Extremity Assessment Upper Extremity Assessment: RUE deficits/detail RUE Deficits / Details: s/p Second stage decompression of the right median nerve. Wrist wrapped in post op dressing. Shoulder and elbow flexors 1/5 MMT. RUE: Unable to fully assess due to immobilization RUE Coordination: decreased gross motor;decreased fine motor   Lower Extremity Assessment Lower Extremity Assessment: Defer to PT evaluation       Communication Communication Communication: No difficulties   Cognition Arousal/Alertness: Awake/alert Behavior During Therapy: WFL for tasks assessed/performed (crying towards end of session) Overall Cognitive Status: Within Functional Limits for tasks assessed                     General Comments       Exercises Exercises: Other exercises Other Exercises Other Exercises: Educated on lap slides for shoulder flexion with pt completing 10. Needs further education on exercise for elbow flexors.   Shoulder Instructions      Home Living Family/patient expects to be discharged to:: Private residence Living Arrangements: Alone Available Help at Discharge: Family;Friend(s);Available 24 hours/day Type of Home: Apartment Home Access: Level entry     Home Layout: One level     Bathroom Shower/Tub: Chief Strategy OfficerTub/shower unit   Bathroom Toilet: Standard     Home Equipment: Cane - single point;Bedside commode;Grab bars - toilet;Grab bars - tub/shower          Prior Functioning/Environment Level of Independence: Independent             OT Diagnosis: Paresis;Acute pain (RUE weakness)   OT Problem List: Decreased strength;Decreased range of motion;Decreased activity tolerance;Impaired balance (sitting and/or standing);Decreased knowledge of use of DME or AE;Decreased knowledge of precautions;Pain;Impaired UE functional use   OT Treatment/Interventions: Self-care/ADL  training;Therapeutic exercise;Energy conservation;DME and/or AE instruction;Therapeutic activities;Patient/family education;Balance training    OT Goals(Current goals can be found in the care plan section) Acute Rehab OT Goals Patient Stated Goal: not stated. OT Goal Formulation: With patient Time For Goal Achievement: 03/11/15 Potential to Achieve Goals: Good ADL Goals Pt Will Perform Grooming: with modified independence;sitting;standing Pt Will Perform Upper Body Dressing: with modified independence;sitting Pt Will Perform Lower Body Dressing: with min guard assist;with adaptive equipment;sit to/from stand Pt Will Transfer to Toilet: with min guard assist;ambulating;bedside commode Pt Will Perform Toileting - Clothing Manipulation and hygiene: with min guard assist;with adaptive equipment;sit to/from stand Pt Will Perform Tub/Shower Transfer: Tub transfer;ambulating;shower seat;with min guard assist;rolling walker Pt/caregiver will Perform Home Exercise Program: Increased ROM;Increased strength;Right Upper extremity;With written HEP provided Additional ADL Goal #1: Pt will complete bed mobility at min A level to prepare for OOB ADLs.  OT Frequency: Min 2X/week   Barriers to D/C:            Co-evaluation              End of Session Equipment Utilized During Treatment: Oxygen;Cervical collar Nurse Communication: Mobility status;Patient requests pain meds;Other (comment) (O2 stats during ambulation.)  Activity Tolerance: Patient limited by pain Patient left: in bed;with call bell/phone within reach;with bed alarm set;with SCD's reapplied   Time: 1610-96041212-1315 OT Time Calculation (min): 63 min Charges:  OT General Charges $OT Visit: 1 Procedure OT Evaluation $Initial OT Evaluation Tier I: 1 Procedure OT Treatments $Self Care/Home Management : 38-52 mins G-Codes:    Pilar GrammesMathews, Zakeya Junker H 02/25/2015, 1:52 PM

## 2015-02-25 NOTE — Progress Notes (Signed)
OT Cancellation Note  Patient Details Name: Elizabeth HenleFrances [name] MRN: [id number] DOB: 1941-01-05   Cancelled Treatment:    Reason Eval/Treat Not Completed: Other (comment) (Pt does not have collar. Family is retrieving from home.) Will hold acute OT evaluation until pt has soft collar.  Pilar GrammesMathews, Niya Behler H 02/25/2015, 9:51 AM

## 2015-02-25 NOTE — Progress Notes (Signed)
RN went to empty drain this morning and after recharging, drain did not stay compressed. Current dressing saturated in blood, drain backed out of incision. On call MD Nudelman paged and instructed RN get rid of drain and to change dressing. Patient in no distress. Will continue to monitor closely. Elizabeth PouchShakenna Giannah Zavadil, RN

## 2015-02-26 ENCOUNTER — Inpatient Hospital Stay (HOSPITAL_COMMUNITY): Payer: Medicare Other

## 2015-02-26 ENCOUNTER — Encounter (HOSPITAL_COMMUNITY): Payer: Self-pay | Admitting: Physical Medicine and Rehabilitation

## 2015-02-26 DIAGNOSIS — M4802 Spinal stenosis, cervical region: Principal | ICD-10-CM

## 2015-02-26 DIAGNOSIS — M5010 Cervical disc disorder with radiculopathy, unspecified cervical region: Secondary | ICD-10-CM

## 2015-02-26 DIAGNOSIS — G5601 Carpal tunnel syndrome, right upper limb: Secondary | ICD-10-CM

## 2015-02-26 NOTE — Progress Notes (Signed)
Patient ID: Elizabeth HenleFrances [name], female   DOB: April 03, 1941, 74 y.o.   MRN: [id number]  Sleepy. Left arm able to move well. Right arm o1/5 deltoids, 2-3/5 biceps triceps 4/5. Sensory nl. Right fingers with good movement.

## 2015-02-26 NOTE — Progress Notes (Signed)
PT Cancellation Note  Patient Details Name: Elizabeth HenleFrances [name] MRN: [id number] DOB: Jan 09, 1941   Cancelled Treatment:    Reason Eval/Treat Not Completed: Patient at procedure or test/unavailable, patient to XRAy will follow up   Fabio AsaWerner, Anush Wiedeman J 02/26/2015, 12:42 PM Charlotte Crumbevon Kaylany Tesoriero, PT DPT  [phone]

## 2015-02-26 NOTE — Progress Notes (Signed)
Patient ID: Elizabeth HenleFrances [name], female   DOB: 24-Dec-1940, 74 y.o.   MRN: [id number] Seen by rehabilitation. Stable. Less pain in arms than before surgery BUT more weakness at 5-6 myotomes. Continue iv steroids

## 2015-02-26 NOTE — Progress Notes (Signed)
Physical Therapy Treatment Patient Details Name: Elizabeth [name] MRN: [id number] DOB: [birth year] Today's Date: 02/26/2015    History of Present Illness 74 y.o female, s/p anterior cervical decompression/discectomy fusion 3 levels and decompression of the median nerve in R hand. PMH significant for RSD (reflex sympathetic dystrophy), fibromyalgia, thyroid disease, lyme disease, blood clot, PMR (polymyalgia rheumatica), bilateral carpal tunnel syndrome.    PT Comments    Patient progressing well with mobility but continues to remain limited by pain. Patient was able to perform activities with min guard assist including bed mobility, transfers (x4) and ambulation. Patient did require cues for safety and caution of RUE. Patient family members concerned regarding patient pain management and do not feel comfortable taking her home with pain levels elevated at this time. Will continue to see and progress as tolerated, anticipate patient tolerance for activity will improve once pain under control. May need to consider ST CIR.   Follow Up Recommendations  Home health PT;Supervision/Assistance - 24 hour (vs CIR)     Equipment Recommendations   (TBD awaiting WBing restrictions)    Recommendations for Other Services       Precautions / Restrictions Precautions Precautions: Cervical;Fall Cervical Brace: Soft collar Restrictions Weight Bearing Restrictions: No    Mobility  Bed Mobility Overal bed mobility: Needs Assistance Bed Mobility: Rolling;Sidelying to Sit;Sit to Sidelying Rolling: Supervision Sidelying to sit: Min guard     Sit to sidelying: Min guard General bed mobility comments: No physical assist required, Cued for attention to RUE during movement  Transfers Overall transfer level: Needs assistance Equipment used: None Transfers: Sit to/from Stand Sit to Stand: Min guard         General transfer comment: Min guard for stability, no physical assist required, patient cued  for attention to RUE  Ambulation/Gait Ambulation/Gait assistance: Min guard Ambulation Distance (Feet): 180 Feet Assistive device:  (pushing IV pole, 20 ft in room without device) Gait Pattern/deviations: Step-through pattern;Narrow base of support;Drifts right/left Gait velocity: decreased Gait velocity interpretation: Below normal speed for age/gender General Gait Details: VCs for candence, min guard for stability secondary to slow guarded gait.   Stairs            Wheelchair Mobility    Modified Rankin (Stroke Patients Only)       Balance Overall balance assessment: History of Falls   Sitting balance-Leahy Scale: Fair       Standing balance-Leahy Scale: Fair Standing balance comment: utlilized IV pole, and furniture at times, unable to accept complete challenge but able to static stand with and without assist                    Cognition Arousal/Alertness: Awake/alert Behavior During Therapy: WFL for tasks assessed/performed Overall Cognitive Status: Within Functional Limits for tasks assessed                      Exercises General Exercises - Upper Extremity Elbow Flexion: Right;Seated;Limitations;PROM Elbow Flexion Limitations: pt unable to flex R elbow Elbow Extension: AROM;Right;Seated Digit Composite Flexion: AROM;Right Composite Extension: Seated;Limitations;PROM Composite Extension Limitations: Pt has limited finger extension Other Exercises Other Exercises: performed transfers x4 (2 from bed) 2 from chair. Other Exercises: Functional task performance with in room navigation around furniture and objects with cues for attention to RUE    General Comments General comments (skin integrity, edema, etc.): Off oxygen for all aspects of mobility, post activity saturations at 92%, re applied O2      Pertinent Vitals/Pain  Pain Assessment: 0-10 Pain Score: 9  Pain Location: neck, RLE, back and right Arm Pain Descriptors / Indicators:  Aching;Discomfort Pain Intervention(s): Monitored during session;Premedicated before session;Repositioned;Relaxation    Home Living                      Prior Function            PT Goals (current goals can now be found in the care plan section) Acute Rehab PT Goals Patient Stated Goal: I want to make the pain go away PT Goal Formulation: With patient Time For Goal Achievement: 03/11/15 Potential to Achieve Goals: Good Progress towards PT goals: Progressing toward goals    Frequency  Min 5X/week    PT Plan Discharge plan needs to be updated    Co-evaluation             End of Session Equipment Utilized During Treatment: Cervical collar;Oxygen Activity Tolerance: Patient limited by pain Patient left: in bed;with call bell/phone within reach;with family/visitor present     Time: [id number] PT Time Calculation (min) (ACUTE ONLY): 24 min  Charges:  $Gait Training: 8-22 mins $Therapeutic Activity: 8-22 mins                    G CodesFabio [name]:      Elizabeth [name] Elizabeth [name] 02/26/2015, 4:10 PM Elizabeth [name] [place], PT DPT  [phone]

## 2015-02-26 NOTE — Progress Notes (Signed)
I met with pt as she ambulated with CNA to bathroom. Pt tolerated well. I will follow up on Monday if pt remains in house to assist with planning a short inpt rehab stay if needed and if bed is available. Other rehab venues should be explored over next 72 hrs. [phone]

## 2015-02-26 NOTE — Consult Note (Signed)
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 Jeral FruitBotero   HPI: Elizabeth [name] is a 74 y.o. female with history of HTN, FMS with chronic pain, h/o PE/DVT, depression, chronic L2 radiculopathy, neck pain radiating to BUE with workup revealing Cervical stenosis C4/5 , C5/6, C6/7 and severe carpel tunnel syndrome R>L.  Patient elected to undergo ACDF C4-C6 with right median nerve compression on 02/25/15. Post op on steroids to help with pain management and inflammation.   PT/OT evaluations done yesterday and patient limited by pain as well as lethargy. CIR recommended for follow up therapy due to increase in RUE weakness as well as h/o RSD liming activity.   Review of Systems  HENT: Negative for hearing loss.   Eyes: Negative for blurred vision and double vision.  Respiratory: Negative for cough.   Cardiovascular: Negative for chest pain and claudication.  Gastrointestinal: Negative for nausea, vomiting and abdominal pain.  Genitourinary: Negative for urgency and frequency.  Musculoskeletal: Positive for myalgias and neck pain.  Neurological: Positive for sensory change and focal weakness (Increase weakness RUE since surgery. ). Negative for dizziness and headaches.      Past Medical History  Diagnosis Date  . Pulmonary embolus   . Lyme disease   . Blood clot in vein   . RSD (reflex sympathetic dystrophy)   . Fibromyalgia   . PMR (polymyalgia rheumatica)   . Thyroid disease   . Hypertension   . Arthritis   . Asthma   . Headache     H/O migraines  . Colitis     appendecticitis    Past Surgical History  Procedure Laterality Date  . Cholecystectomy    . Back surgery    . Eye surgery      Lasik  . Ivc filter    . Thrombectomy / embolectomy vena cava    . Appendectomy    . Dilation and curettage of uterus    . Anterior cervical decomp/discectomy fusion N/A 02/24/2015   Procedure: Cervical four-five cervical five-six cervical six- seven anterior cervical decompression/ diskectomy/ fusion;  Surgeon: Hilda LiasErnesto Botero, MD;  Location: [facility];  Service: Neurosurgery;  Laterality: N/A;  C4-5 C5-6 C6-7 Anterior cervical decompression/diskectomy/fusion  . Carpal tunnel release Right 02/24/2015    Procedure: RIGHT CARPAL TUNNEL RELEASE;  Surgeon: Hilda LiasErnesto Botero, MD;  Location: [facility];  Service: Neurosurgery;  Laterality: Right;  Right Carpal tunnel release    Family History  Problem Relation Age of Onset  . Cancer Daughter     Social History:  Lives alone and independent PTA. Has brother in town who can check in past discharge. Retired reports that she has never smoked. She has never used smokeless tobacco. She reports that she drinks alcohol.  She has used marijuana in the past.      Allergies  Allergen Reactions  . Ivp Dye [Iodinated Diagnostic Agents] Anaphylaxis  . Gabapentin     confusion  . Codeine Palpitations   Medications Prior to Admission  Medication Sig Dispense Refill  . albuterol (PROVENTIL) (2.5 MG/3ML) 0.083% nebulizer solution Take 2.5 mg by nebulization every 6 (six) hours as needed for wheezing or shortness of breath.    . citalopram (CELEXA) 10 MG tablet Take 20 mg by mouth daily.     . cyclobenzaprine (FLEXERIL) 10 MG tablet Take 10 mg by mouth 3 (three) times daily as needed.  5  . diazepam (VALIUM)  5 MG tablet Take 5 mg by mouth every 6 (six) hours as needed for anxiety.    Marland Kitchen levothyroxine (SYNTHROID, LEVOTHROID) 75 MCG tablet Take 75 mcg by mouth daily before breakfast.    . lidocaine (LIDODERM) 5 % Place 1 patch onto the skin daily. Remove & Discard patch within 12 hours or as directed by MD    . oxyCODONE-acetaminophen (PERCOCET/ROXICET) 5-325 MG per tablet Take 1-2 tablets by mouth every 4 (four) hours as needed for severe pain. 15 tablet 0  . simvastatin (ZOCOR) 10 MG tablet Take 10 mg by mouth at bedtime.    . verapamil  (CALAN) 80 MG tablet Take 80 mg by mouth 2 (two) times daily.    Marland Kitchen CALCIUM PO Take 1 tablet by mouth daily.    . DULoxetine (CYMBALTA) 20 MG capsule Take 1 capsule (20 mg total) by mouth 2 (two) times daily. (Patient not taking: Reported on 02/05/2015) 60 capsule 5  . ondansetron (ZOFRAN ODT) 4 MG disintegrating tablet Take 1 tablet (4 mg total) by mouth every 8 (eight) hours as needed for nausea or vomiting. (Patient not taking: Reported on 02/16/2015) 10 tablet 0    Home: Home Living Family/patient expects to be discharged to:: Private residence Living Arrangements: Alone Available Help at Discharge: Family, Friend(s), Available 24 hours/day Type of Home: Apartment Home Access: Level entry Home Layout: One level Home Equipment: Cane - single point, Bedside commode, Grab bars - toilet, Grab bars - tub/shower  Functional History: Prior Function Level of Independence: Independent Functional Status:  Mobility: Bed Mobility Overal bed mobility: Needs Assistance Bed Mobility: Rolling, Sidelying to Sit, Sit to Sidelying Rolling: Supervision Sidelying to sit: Min assist Sit to sidelying: Mod assist General bed mobility comments: Min assist to elevate trunk and manage lines to come to EOB moderate assist to return to beds for LE elevation back to bed. VCS for technique and sequencing with sidelying Transfers Overall transfer level: Needs assistance Equipment used: 1 person hand held assist Transfers: Sit to/from Stand, Stand Pivot Transfers Sit to Stand: Min assist Stand pivot transfers: Min assist General transfer comment: min assist for stability in elevation to standing, assist for RUE support (NWBing for precaution) Ambulation/Gait Ambulation/Gait assistance: Min assist Ambulation Distance (Feet): 6 Feet Assistive device: 1 person hand held assist General Gait Details: steps to navigate and reposition towards HOB, 3 lateral in addition to forward steps (min assist for stability and  RUE support)    ADL: ADL Overall ADL's : Needs assistance/impaired Eating/Feeding: Moderate assistance, Sitting Grooming: Moderate assistance, Sitting Upper Body Bathing: Moderate assistance, Sitting Lower Body Bathing: Moderate assistance, Sit to/from stand Upper Body Dressing : Moderate assistance, Sitting Lower Body Dressing: Moderate assistance, Sit to/from stand Toilet Transfer: Minimal assistance, Ambulation, BSC Toileting- Clothing Manipulation and Hygiene: Sit to/from stand, Minimal assistance Tub/ Shower Transfer: Minimal assistance, Ambulation, Shower seat Functional mobility during ADLs: Minimal assistance General ADL Comments: Pt completed logrolling to EOB. Pt then walked around bed to access recliner with min A +1 HHA. Pt transfered to recliner but was unable to maintain sitting in recliner more than 5 minutes due to increased nack and back pain. Pt requested to return to bed and was upset, crying. Repositioned in bed, notifed nurse of pain med request.   Cognition: Cognition Overall Cognitive Status: Within Functional Limits for tasks assessed Orientation Level: Oriented X4 Cognition Arousal/Alertness: Awake/alert Behavior During Therapy: WFL for tasks assessed/performed Overall Cognitive Status: Within Functional Limits for tasks assessed  Blood pressure 133/72, pulse  89, temperature 98 F (36.7 C), temperature source Oral, resp. rate 20, height  (1.651 m), weight 67.949 kg (149 lb 12.8 oz), SpO2 96 %. Physical Exam  Nursing note and vitals reviewed. Constitutional: She is oriented to person, place, and time. She appears well-developed and well-nourished.  Moaning due to neck pain. Soft collar in place  HENT:  Head: Normocephalic and atraumatic.  Eyes: Conjunctivae are normal. Pupils are equal, round, and reactive to light.  Neck: Normal range of motion. Neck supple.  Cardiovascular: Normal rate and regular rhythm.   No murmur heard. Respiratory: Effort  normal and breath sounds normal. No respiratory distress. She has no wheezes.  GI: Soft. Bowel sounds are normal. She exhibits no distension. There is no tenderness.  Musculoskeletal: She exhibits no edema.  Neurological: She is alert and oriented to person, place, and time.  Soft voice. RUE.LUE weakness. LUE with ataxia and sensory deficits (LT and pain)  BUE. RUE also quite limited due to wrist pain/surgery. Strength 2 to 3/5 bilateral UE's. LE's 4/5 prox to distal. Cognitively appropriate.  Skin: Skin is warm and dry.  Psychiatric: Her speech is normal. Her mood appears anxious. She is slowed. Cognition and memory are normal.    No results found for this or any previous visit (from the past 24 hour(s)). Dg Cervical Spine 2-3 Views  02/24/2015   CLINICAL DATA:  ACDF at C4-5-6-7.  EXAM: CERVICAL SPINE - 2-3 VIEW  COMPARISON:  01/24/2015  FINDINGS: The series of 6 intraoperative images of the cervical spine demonstrate level localization, for example on image # 2 there is needle probe localization at the C4-5 intervertebral level.  On image # 3, a plate and screw fixator is visible at C4-C5-C6 and perhaps extending further caudad, with interbody graft/ spacer visible at the C4-5 level. The amount of threaded screw capture of the vertebra is difficult to be certain of as the anterior cortical margins are mildly indistinct.  IMPRESSION: 1. ACDF with anterior plate at Z6-X0-R6 and possibly extending down to C7, although obscured by the patient's shoulders in its lower portion.   Electronically Signed   By: Gaylyn Rong M.D.   On: 02/24/2015 16:33    Assessment/Plan: Diagnosis: cervical stenosis with bilateral radiculopathy, bilateral CTS (s/p right CTS release) 1. Does the need for close, 24 hr/day medical supervision in concert with the patient's rehab needs make it unreasonable for this patient to be served in a less intensive setting? Yes 2. Co-Morbidities requiring supervision/potential  complications: HTN, severe pain, lumbar spondylosis with stenosis and radiculopathy 3. Due to bladder management, bowel management, safety, skin/wound care, disease management, medication administration, pain management and patient education, does the patient require 24 hr/day rehab nursing? Yes 4. Does the patient require coordinated care of a physician, rehab nurse, PT (1-2 hrs/day, 5 days/week) and OT (1-2 hrs/day, 5 days/week) to address physical and functional deficits in the context of the above medical diagnosis(es)? Yes Addressing deficits in the following areas: balance, endurance, locomotion, strength, transferring, bowel/bladder control, bathing, dressing, feeding, grooming, toileting and psychosocial support 5. Can the patient actively participate in an intensive therapy program of at least 3 hrs of therapy per day at least 5 days per week? Potentially 6. The potential for patient to make measurable gains while on inpatient rehab is good 7. Anticipated functional outcomes upon discharge from inpatient rehab are modified independent  with PT, modified independent, supervision and min assist with OT, n/a with SLP. 8. Estimated rehab length of stay to  reach the above functional goals is: 7 days 9. Does the patient have adequate social supports and living environment to accommodate these discharge functional goals? Yes 10. Anticipated D/C setting: Home 11. Anticipated post D/C treatments: HH therapy and Outpatient therapy 12. Overall Rehab/Functional Prognosis: excellent  RECOMMENDATIONS: This patient's condition is appropriate for continued rehabilitative care in the following setting: CIR potentially pending better pain control (vs HH) Patient has agreed to participate in recommended program. Yes and Potentially Note that insurance prior authorization may be required for reimbursement for recommended care.  Comment: Will follow along for pain/activity tolerance. Upper extremity/OT tasks  and pain will be biggest, rate-limiting factors.   Ranelle Oyster, MD, [facility] Physical Medicine & Rehabilitation 02/26/2015     02/26/2015

## 2015-02-26 NOTE — Progress Notes (Signed)
Occupational Therapy Treatment Patient Details Name: Elizabeth [name] MRN: [id number] DOB: 04-Jun-1941 Today's Date: 02/26/2015    History of present illness 74 y.o female, s/p anterior cervical decompression/discectomy fusion 3 levels and decompression of the median nerve in R hand. PMH significant for RSD (reflex sympathetic dystrophy), fibromyalgia, thyroid disease, lyme disease, blood clot, PMR (polymyalgia rheumatica), bilateral carpal tunnel syndrome.   OT comments  Pt in bed attempting to self feed with L hand upon OT arrival.  Pt moaning and reporting pain in neck and R hand, RN notified. Pt reports being R handed, but says they can use L hand for functional activity. Pt unable to self feed with R hand. Pt can reach items on tray with L hand, but unable to open containers or use spoon with L hand. Pt can self feed using L hand to pick up jello with fingers. Pt has limited R UE digit extension, no R elbow flexion and can not supinate R forearm. Pt able to complete R shoulder shrug with gravity eliminated, pushing hand from hip to knee and gliding back. Pt has carpal tunnel in B UE. Pt would benefit from skilled OT service for R UE ROM, tendon gliding exercises and education on maintaining cervical precautions in preparation for ADLs.  Follow Up Recommendations  Supervision/Assistance - 24 hour;Home health OT;Other (comment) (may need SNF depending on progress)    Equipment Recommendations       Recommendations for Other Services      Precautions / Restrictions Precautions Precautions: Cervical;Fall Cervical Brace: Soft collar Restrictions Weight Bearing Restrictions: No       Mobility Bed Mobility                  Transfers                      Balance                                   ADL Overall ADL's : Needs assistance/impaired Eating/Feeding: Moderate assistance;Bed level Eating/Feeding Details (indicate cue type and reason): Pt trying to  self-feed with L hand, required A opening containers.                                          Vision                     Perception     Praxis      Cognition   Behavior During Therapy: WFL for tasks assessed/performed Overall Cognitive Status: Within Functional Limits for tasks assessed                       Extremity/Trunk Assessment               Exercises General Exercises - Upper Extremity Elbow Flexion: Right;Seated;Limitations;PROM Elbow Flexion Limitations: pt unable to flex R elbow Elbow Extension: AROM;Right;Seated Digit Composite Flexion: AROM;Right Composite Extension: Seated;Limitations;PROM Composite Extension Limitations: Pt has limited finger extension Other Exercises Other Exercises: shoulder shrug with gravity eliminated, Pt pushed hand from hip to knee and glided back. Pt unable to supinate R UE or adduct to body   Shoulder Instructions       General Comments      Pertinent Vitals/ Pain  Pain Assessment: 0-10 Pain Score: 8  Pain Location: neck Pain Descriptors / Indicators: Aching;Grimacing;Moaning Pain Intervention(s): Limited activity within patient's tolerance;Monitored during session;Repositioned  Home Living                                          Prior Functioning/Environment              Frequency Min 2X/week     Progress Toward Goals  OT Goals(current goals can now be found in the care plan section)     Acute Rehab OT Goals Patient Stated Goal: to eat lunch OT Goal Formulation: With patient Time For Goal Achievement: 03/11/15 Potential to Achieve Goals: Good ADL Goals Pt Will Perform Grooming: with modified independence;sitting;standing Pt Will Perform Upper Body Dressing: with modified independence;sitting Pt Will Perform Lower Body Dressing: with min guard assist;with adaptive equipment;sit to/from stand Pt Will Transfer to Toilet: with min guard  assist;ambulating;bedside commode Pt Will Perform Toileting - Clothing Manipulation and hygiene: with min guard assist;with adaptive equipment;sit to/from stand Pt Will Perform Tub/Shower Transfer: Tub transfer;ambulating;shower seat;with min guard assist;rolling walker Pt/caregiver will Perform Home Exercise Program: Increased ROM;Increased strength;Right Upper extremity;With written HEP provided Additional ADL Goal #1: Pt will complete bed mobility at min A level to prepare for OOB ADLs.  Plan Discharge plan remains appropriate    Co-evaluation                 End of Session Equipment Utilized During Treatment: Oxygen;Cervical collar   Activity Tolerance Patient limited by pain   Patient Left in bed;with call bell/phone within reach   Nurse Communication Mobility status;Patient requests pain meds        Time: 7829-5621 OT Time Calculation (min): 16 min  Charges: OT General Charges $OT Visit: 1 Procedure OT Treatments $Therapeutic Exercise: 8-22 mins  Marden Noble 02/26/2015, 2:21 PM

## 2015-02-26 NOTE — Progress Notes (Signed)
Guaze dressings removed from right hand and neck per MD orders.

## 2015-02-27 NOTE — Progress Notes (Signed)
Patient ID: Elizabeth [name], female   DOB: Jul 11, 1941, 74 y.o.   MRN: [id number] [name], vss Still with the persistent right arm weakness that she says is worse than pre op. She can walk fine, so this most likely represents nerve root swelling and inflammation. Will continue steroids for now.

## 2015-02-27 NOTE — Progress Notes (Addendum)
Occupational Therapy Treatment Patient Details Name: Barrett HenleFrances Honda MRN: [id number] DOB: Mar 31, 1941 Today's Date: 02/27/2015    History of present illness 74 y.o female, s/p anterior cervical decompression/discectomy fusion 3 levels and decompression of the median nerve in R hand. PMH significant for RSD (reflex sympathetic dystrophy), fibromyalgia, thyroid disease, lyme disease, blood clot, PMR (polymyalgia rheumatica), bilateral carpal tunnel syndrome.   OT comments  Pt progressing toward goals. Recommending CIR for rehab. Feel pt will continue to benefit from acute OT to increase independence and address RUE prior to d/c.  Follow Up Recommendations  Supervision/Assistance - 24 hour;CIR    Equipment Recommendations  Other (comment) (TBD)    Recommendations for Other Services      Precautions / Restrictions Precautions Precautions: Cervical;Fall Required Braces or Orthoses: Cervical Brace Cervical Brace: Soft collar Restrictions Weight Bearing Restrictions: No       Mobility Bed Mobility Overal bed mobility: Needs Assistance Bed Mobility: Rolling;Sidelying to Sit;Sit to Sidelying Rolling: Supervision Sidelying to sit: Supervision     Sit to sidelying: Supervision General bed mobility comments: assist and cues for scooting HOB. Cues for log roll technique.   Transfers Overall transfer level: Needs assistance   Transfers: Sit to/from Stand Sit to Stand: Min guard              Balance Overall balance assessment: History of Falls        Min guard for ambulation.                         ADL Overall ADL's : Needs assistance/impaired Eating/Feeding: Sitting;Modified independent Eating/Feeding Details (indicate cue type and reason): pt able to drink water with left hand Grooming: Wash/dry face;Wash/dry hands;Set up;Supervision/safety;Standing       Lower Body Bathing: Set up;Supervison/ safety (standing at sink) Lower Body Bathing Details (indicate  cue type and reason): washed buttocks and front of upper legs Upper Body Dressing : Minimal assistance;Sitting Upper Body Dressing Details (indicate cue type and reason): OT tied/untied gown for pt and assisted with donning. Educated on technique.  Lower Body Dressing: Supervision/safety;Sitting/lateral leans (doffed socks by using feet to do so)   Toilet Transfer: Min guard;Ambulation   Toileting- Clothing Manipulation and Hygiene: Minimal assistance;Sit to/from stand;Sitting/lateral lean Toileting - Clothing Manipulation Details (indicate cue type and reason): assistance to move gown some      Functional mobility during ADLs: Min guard General ADL Comments: Cues for precautions in session. Tried to see if pt could cross legs over knees for LB dressing, but unable to do so, so used feet to slide socks off.  Briefly explained CIR rehab to pt.  OT adjusted pt's cervical collar for her. Talked briefly about pt using right hand to support items.     Vision                     Perception     Praxis      Cognition  Awake/Alert Behavior During Therapy: WFL for tasks assessed/performed Overall Cognitive Status: No family/caregiver present to determine baseline cognitive functioning (pt reports she gets confused with her right and left hand and she is ambidextrous and OT explained tip of how pt could tell the difference)                        Extremity/Trunk Assessment               Exercises Other Exercises Other Exercises: Pt  performed table slides with right shoulder. Pt performed approximtaly 10 reps each of AROM right digit composite flexion/extension sitting EOB and P/AAROM elbow flexion/extension (performed in bed).    Shoulder Instructions       General Comments      Pertinent Vitals/ Pain       Pain Assessment: 0-10 Pain Score: 7  Pain Location: neck Pain Descriptors / Indicators: Stabbing Pain Intervention(s): Monitored during session;Repositioned    O2 in 90s after OT had placed pt back on O2 towards end of session-pt went to bathroom on RA.    Home Living                                          Prior Functioning/Environment              Frequency Min 2X/week     Progress Toward Goals  OT Goals(current goals can now be found in the care plan section)  Progress towards OT goals: Progressing toward goals  Acute Rehab OT Goals Patient Stated Goal: not stated OT Goal Formulation: With patient Time For Goal Achievement: 03/11/15 Potential to Achieve Goals: Good ADL Goals Pt Will Perform Grooming: with modified independence;sitting;standing Pt Will Perform Upper Body Dressing: with modified independence;sitting Pt Will Perform Lower Body Dressing: with min guard assist;with adaptive equipment;sit to/from stand Pt Will Transfer to Toilet: with min guard assist;ambulating;bedside commode Pt Will Perform Toileting - Clothing Manipulation and hygiene: with min guard assist;with adaptive equipment;sit to/from stand Pt Will Perform Tub/Shower Transfer: Tub transfer;ambulating;shower seat;with min guard assist;rolling walker Pt/caregiver will Perform Home Exercise Program: Increased ROM;Increased strength;Right Upper extremity;With written HEP provided Additional ADL Goal #1: Pt will complete bed mobility at min A level to prepare for OOB ADLs.  Plan Discharge plan needs to be updated    Co-evaluation                 End of Session Equipment Utilized During Treatment: Oxygen;Cervical collar;Gait belt (O2 for part of session)   Activity Tolerance Patient tolerated treatment well   Patient Left in bed;with call bell/phone within reach;with bed alarm set;with SCD's reapplied   Nurse Communication Other (comment) (pt needs socks)        Time: 1610-9604 OT Time Calculation (min): 23 min  Charges: OT General Charges $OT Visit: 1 Procedure OT Treatments $Self Care/Home Management : 8-22 mins     Earlie Raveling OTR/L [phone] 02/27/2015, 12:24 PM

## 2015-02-28 MED ORDER — SENNOSIDES-DOCUSATE SODIUM 8.6-50 MG PO TABS
1.0000 | ORAL_TABLET | Freq: Two times a day (BID) | ORAL | Status: DC
  Administered 2015-02-28 – 2015-03-02 (×4): 1 via ORAL
  Filled 2015-02-28 (×4): qty 1

## 2015-02-28 NOTE — Plan of Care (Signed)
Problem: Consults Goal: Diagnosis - Spinal Surgery Outcome: Completed/Met Date Met:  02/28/15 Cervical Spine Fusion

## 2015-02-28 NOTE — Progress Notes (Addendum)
No issues overnight. Pt reports no significant change in strength. Tolerating diet well.  EXAM:  BP 127/58 mmHg  Pulse 85  Temp(Src) 98 F (36.7 C) (Oral)  Resp 17  Ht 5\' 5"  (1.651 m)  Wt 67.949 kg (149 lb 12.8 oz)  BMI 24.93 kg/m2  SpO2 93%  Awake, alert, oriented  Speech fluent, appropriate  CN grossly intact  5/5 BLE, LUE 0-1/5 right deltoid, 0-1/5 right bicep 3/5 right tricep, wrist extension, 3-4/5 intrinsics/grip  IMPRESSION:  74 y.o. female with RUE weakness after ACDF  PLAN: - Cont PT/OT - Likely CIR

## 2015-03-01 DIAGNOSIS — E079 Disorder of thyroid, unspecified: Secondary | ICD-10-CM

## 2015-03-01 LAB — TSH: TSH: 0.168 u[IU]/mL — ABNORMAL LOW (ref 0.350–4.500)

## 2015-03-01 MED ORDER — HYDRALAZINE HCL 20 MG/ML IJ SOLN: 10.0000 mg | Freq: Three times a day (TID) | INTRAMUSCULAR | Status: DC | PRN

## 2015-03-01 MED ORDER — ISOSORB DINITRATE-HYDRALAZINE 20-37.5 MG PO TABS
0.5000 | ORAL_TABLET | Freq: Two times a day (BID) | ORAL | Status: DC
  Administered 2015-03-01 – 2015-03-02 (×3): 0.5 via ORAL
  Filled 2015-03-01 (×4): qty 0.5

## 2015-03-01 NOTE — Progress Notes (Signed)
Patient ID: Elizabeth HenleFrances [name], female   DOB: 05-Dec-1940, [age] y.o.   MRN: [id number] Biopsy of neck lymph node was negative. Sensory normal in right arm but i/5 biceps and deltoids. Wound dry. Home in am with outpatient pt

## 2015-03-01 NOTE — Progress Notes (Signed)
Physical Therapy Treatment Patient Details Name: Elizabeth HenleFrances Middendorf MRN: [id number] DOB: 1941/07/11 Today's Date: 03/01/2015    History of Present Illness 74 y.o female, s/p anterior cervical decompression/discectomy fusion 3 levels and decompression of the median nerve in R hand. PMH significant for RSD (reflex sympathetic dystrophy), fibromyalgia, thyroid disease, lyme disease, blood clot, PMR (polymyalgia rheumatica), bilateral carpal tunnel syndrome.    PT Comments    Patient mobilizing very well, able to utilize RW this session without difficulty (reports that it was comfortable to use for RUE support). Patient performed bed mobility and transfers without assist. At this time, consider recommendation of HHPT with 24/7 supervision/assist vs CIR. Will continue to see and progress as tolerated.   Follow Up Recommendations  Home health PT;Supervision/Assistance - 24 hour (vc CIR)     Equipment Recommendations  Rolling walker with 5" wheels    Recommendations for Other Services       Precautions / Restrictions Precautions Precautions: Cervical;Fall Required Braces or Orthoses: Cervical Brace Cervical Brace: Soft collar (off in bed and when eating) Restrictions Weight Bearing Restrictions: No Other Position/Activity Restrictions: carpal tunnel release right hand    Mobility  Bed Mobility Overal bed mobility: Needs Assistance Bed Mobility: Supine to Sit;Sit to Supine     Supine to sit: Supervision Sit to supine: Supervision   General bed mobility comments: No physical assist or cues required, patient very attentive to RUE, was able to reach across and position RUE prior to initiating movement without needing to be cued  Transfers Overall transfer level: Needs assistance Equipment used: Rolling walker (2 wheeled) Transfers: Sit to/from Stand Sit to Stand: Supervision Stand pivot transfers: Min assist          Ambulation/Gait Ambulation/Gait assistance: Supervision;Min  guard Ambulation Distance (Feet): 240 Feet (20 ft without device) Assistive device: Rolling walker (2 wheeled) Gait Pattern/deviations: Step-through pattern;Narrow base of support;Drifts right/left Gait velocity: decreased Gait velocity interpretation: Below normal speed for age/gender General Gait Details: Patient ambulating in halls with RW, no physical assist, able to navigate corners and turns   Stairs            Wheelchair Mobility    Modified Rankin (Stroke Patients Only)       Balance     Sitting balance-Leahy Scale: Fair       Standing balance-Leahy Scale: Fair Standing balance comment: use of RW during in hall mobility, no device during in room mobility                    Cognition Arousal/Alertness: Awake/alert Behavior During Therapy: [facility] LLCWFL for tasks assessed/performed Overall Cognitive Status: History of cognitive impairments - at baseline                      Exercises Other Exercises Other Exercises: Supine (due to increased BP). Worked on 5 reps of AAROM for shoulder flexion (minimal)/extension(stronger, but has decreased co-contraction control); shoulder abduction (30 degrees AROM)/adduction (full from about 80 degrees of abduction); shoulder intenal rotation (weak but present)/external rotation (trace); elbow flexion (not seen)/extension (full but no co-contraction for control); forearm supination (1/3 AROM with effort)/pronation (full but without co-contraction for control); wrist flexion/extension (full); compiste grip (full but with decreased strength). Instructed pt in SROM for shoulder flexion. AROM wrist flexion/extension, and AROM composite finger flexion/extension    General Comments        Pertinent Vitals/Pain Pain Assessment: 0-10 Pain Score: 4  Pain Location: neck and shoulders Pain Descriptors / Indicators: Sore  Pain Intervention(s): Monitored during session;Repositioned    Home Living                       Prior Function            PT Goals (current goals can now be found in the care plan section) Acute Rehab PT Goals Patient Stated Goal: none stated PT Goal Formulation: With patient Time For Goal Achievement: 03/11/15 Potential to Achieve Goals: Good Progress towards PT goals: Progressing toward goals    Frequency  Min 5X/week    PT Plan Discharge plan needs to be updated    Co-evaluation             End of Session Equipment Utilized During Treatment: Cervical collar;Oxygen Activity Tolerance: Patient tolerated treatment well;No increased pain Patient left: in chair;with call bell/phone within reach;with chair alarm set     Time: 1130-1152 PT Time Calculation (min) (ACUTE ONLY): 22 min  Charges:  $Gait Training: 8-22 mins                    G CodesFabio Asa 03-26-2015, 12:08 PM Charlotte Crumb, PT DPT  [phone]

## 2015-03-01 NOTE — Consult Note (Signed)
[facility] Medical Consultation  Barrett HenleFrances Bergemann [id number] DOB: Dec 18, 1940 DOA: 02/24/2015 PCP: Frederich ChickWEBB, CAROL D, MD   Requesting physician: Dr. Jeral FruitBotero Date of consultation: 03/01/15 Reason for consultation: elevated BP  Impression/Recommendations 1-Cervical stenosis of spinal canal: s/p ACDF. Further workup and decisions post surgical intervention per primary team  2-HTN: continue verapamil and will add bidil 1/2 tablet BID -her elevated BP most likely due to steroids -recommend gentle adjustments of medications to prevent orthostatic manifestations -PRN hydralazine  3-thyroid disease: will check TSH  *rest of her medical problems are stable and will continue current management  I will followup again tomorrow. Please contact me if I can be of assistance in the meanwhile. Thank you for this consultation.  Chief Complaint: neck pain with radiculopathy   HPI:  74 y/o female with pmh significant for HTN, thyroid disease, hx of PMR, asthma, migraine and hx of PE; who was admitted by neurosurgery due to neck pain with radiculopathy. Patient ended having ACDF and after surgical procedure has been experiencing difficult to control BP. Patient denies HA's, CP, SOB, abd pain,blurred vision or any other complaints. [facility] called to help with BP control  Review of Systems:  Negative except as mentioned on HPI.  Past Medical History  Diagnosis Date  . Pulmonary embolus   . Lyme disease   . Blood clot in vein   . RSD (reflex sympathetic dystrophy)   . Fibromyalgia   . PMR (polymyalgia rheumatica)   . Thyroid disease   . Hypertension   . Arthritis   . Asthma   . Headache     H/O migraines  . Colitis     appendecticitis   Past Surgical History  Procedure Laterality Date  . Cholecystectomy    . Back surgery    . Eye surgery      Lasik  . Ivc filter    . Thrombectomy / embolectomy vena cava    . Appendectomy    . Dilation and curettage of uterus    . Anterior cervical  decomp/discectomy fusion N/A 02/24/2015    Procedure: Cervical four-five cervical five-six cervical six- seven anterior cervical decompression/ diskectomy/ fusion;  Surgeon: Hilda LiasErnesto Botero, MD;  Location: [facility];  Service: Neurosurgery;  Laterality: N/A;  C4-5 C5-6 C6-7 Anterior cervical decompression/diskectomy/fusion  . Carpal tunnel release Right 02/24/2015    Procedure: RIGHT CARPAL TUNNEL RELEASE;  Surgeon: Hilda LiasErnesto Botero, MD;  Location: [facility];  Service: Neurosurgery;  Laterality: Right;  Right Carpal tunnel release   Social History:  reports that she has never smoked. She has never used smokeless tobacco. She reports that she drinks alcohol. She reports that she does not use illicit drugs.  Allergies  Allergen Reactions  . Ivp Dye [Iodinated Diagnostic Agents] Anaphylaxis  . Dilaudid [Hydromorphone Hcl] Other (See Comments)    Pt. States that it makes her crazy  . Gabapentin     confusion  . Codeine Palpitations   Family History  Problem Relation Age of Onset  . Cancer Daughter     Prior to Admission medications   Medication Sig Start Date End Date Taking? Authorizing Provider  albuterol (PROVENTIL) (2.5 MG/3ML) 0.083% nebulizer solution Take 2.5 mg by nebulization every 6 (six) hours as needed for wheezing or shortness of breath.   Yes Historical Provider, MD  citalopram (CELEXA) 10 MG tablet Take 20 mg by mouth daily.    Yes Historical Provider, MD  cyclobenzaprine (FLEXERIL) 10 MG tablet Take 10 mg by mouth 3 (three) times daily  as needed. 09/28/14  Yes Historical Provider, MD  diazepam (VALIUM) 5 MG tablet Take 5 mg by mouth every 6 (six) hours as needed for anxiety.   Yes Historical Provider, MD  levothyroxine (SYNTHROID, LEVOTHROID) 75 MCG tablet Take 75 mcg by mouth daily before breakfast.   Yes Historical Provider, MD  lidocaine (LIDODERM) 5 % Place 1 patch onto the skin daily. Remove & Discard patch within 12 hours or as directed by MD   Yes Historical Provider, MD   oxyCODONE-acetaminophen (PERCOCET/ROXICET) 5-325 MG per tablet Take 1-2 tablets by mouth every 4 (four) hours as needed for severe pain. 02/06/15  Yes Kaitlyn Szekalski, PA-C  simvastatin (ZOCOR) 10 MG tablet Take 10 mg by mouth at bedtime.   Yes Historical Provider, MD  verapamil (CALAN) 80 MG tablet Take 80 mg by mouth 2 (two) times daily.   Yes Historical Provider, MD  CALCIUM PO Take 1 tablet by mouth daily.    Historical Provider, MD  DULoxetine (CYMBALTA) 20 MG capsule Take 1 capsule (20 mg total) by mouth 2 (two) times daily. Patient not taking: Reported on 02/05/2015 10/29/14   Erick Colace, MD  ondansetron (ZOFRAN ODT) 4 MG disintegrating tablet Take 1 tablet (4 mg total) by mouth every 8 (eight) hours as needed for nausea or vomiting. Patient not taking: Reported on 02/16/2015 02/06/15   Emilia Beck, PA-C   Physical Exam: Blood pressure 150/78, pulse 77, temperature 97.7 F (36.5 C), temperature source Oral, resp. rate 16, height  (1.651 m), weight 67.949 kg (149 lb 12.8 oz), SpO2 98 %. Filed Vitals:   03/01/15 1733  BP: 150/78  Pulse: 77  Temp: 97.7 F (36.5 C)  Resp: 16     General:  Alert, awake and oriented 3; afebrile and just complaining of moderate pain on her shoulder and back  Eyes: PERRLA, no icterus, no nystagmus; extraocular muscles intact  ENT: Inside her mouth there was No erythema, no exudates, no thrush; no drainage out of her ears or nostrils  Neck: No thyromegaly, no JVD appreciated on exam  Cardiovascular: Regular rate, no rubs or gallops  Respiratory: Good air movement bilaterally, no wheezing or crackles  Abdomen: Soft, nontender, nondistended, positive bowel sounds  Skin: Warm and dry, no rash, no petechiae  Musculoskeletal: No lower extremity edema  Psychiatric: normal mood, no SI or hallucinations  Neurologic: decrease movement on her RUE post surgery, no focal weakness, AAOX3 and able to follow commands appropriately  Labs  on Admission:  CBG:  Recent Labs Lab 02/25/15 0801  GLUCAP 293*    Radiological Exams on Admission: No results found.  EKG:  None  Time spent: 50 minutes  Vassie Loll [facility] Pager [phone]  If 7PM-7AM, please contact night-coverage [email or web address] Password [facility] 03/01/2015, 6:47 PM

## 2015-03-01 NOTE — Progress Notes (Signed)
Patient ID: Elizabeth HenleFrances [name], female   DOB: 24-Oct-1940, 74 y.o.   MRN: [id number] Stable. Dense weakness or right deltois and biceps. Sensory wnl. Blood pressure quite high. hospitalist to see her

## 2015-03-01 NOTE — Progress Notes (Signed)
Occupational Therapy Treatment Patient Details Name: Elizabeth [name] MRN: [id number] DOB: 03-31-1941 Today's Date: 03/01/2015    History of present illness 74 y.o female, s/p anterior cervical decompression/discectomy fusion 3 levels and decompression of the median nerve in R hand. PMH significant for RSD (reflex sympathetic dystrophy), fibromyalgia, thyroid disease, lyme disease, blood clot, PMR (polymyalgia rheumatica), bilateral carpal tunnel syndrome.   OT comments  This 74 yo female admitted and underwent above presents to acute OT with not making progress today due to increased BP (MD and RN aware). Feel she is a great candidate for inpatient rehab to get her to a Mod I to Independent level to return home alone.  Follow Up Recommendations  CIR;Supervision/Assistance - 24 hour    Equipment Recommendations   (TBD at next venue)       Precautions / Restrictions Precautions Precautions: Cervical;Fall Required Braces or Orthoses: Cervical Brace Cervical Brace: Soft collar (off in bed and when eating) Restrictions Weight Bearing Restrictions: No Other Position/Activity Restrictions: carpal tunnel release right hand       Mobility Bed Mobility Overal bed mobility: Needs Assistance Bed Mobility: Supine to Sit;Sit to Supine     Supine to sit: Supervision Sit to supine: Supervision      Transfers Overall transfer level: Needs assistance Equipment used: 1 person hand held assist Transfers: Sit to/from Stand Sit to Stand: Min assist Stand pivot transfers: Min assist                ADL Overall ADL's : Needs assistance/impaired                         Toilet Transfer: Minimal assistance;Stand-pivot;BSC Toilet Transfer Details (indicate cue type and reason): Only did stand pivot v. going into bathroom due to increased BP Toileting- Clothing Manipulation and Hygiene: Min guard;Sit to/from stand                Vision                 Additional  Comments: No change from baseline          Cognition   Behavior During Therapy: [facility] for tasks assessed/performed Overall Cognitive Status:  (Just of note, pt states that she came in for the right carpal tunnel release and is not sure why she had the neck surgery. When Dr. Jeral Fruit entered the room, I mentioned this to him and he said no she came in for both.)                         Exercises Other Exercises Other Exercises: Supine (due to increased BP). Worked on 5 reps of AAROM for shoulder flexion (minimal)/extension(stronger, but has decreased co-contraction control); shoulder abduction (30 degrees AROM)/adduction (full from about 80 degrees of abduction); shoulder intenal rotation (weak but present)/external rotation (trace); elbow flexion (not seen)/extension (full but no co-contraction for control); forearm supination (1/3 AROM with effort)/pronation (full but without co-contraction for control); wrist flexion/extension (full); compiste grip (full but with decreased strength). Instructed pt in SROM for shoulder flexion. AROM wrist flexion/extension, and AROM composite finger flexion/extension           Pertinent Vitals/ Pain       Pain Assessment: No/denies pain         Frequency Min 2X/week     Progress Toward Goals  OT Goals(current goals can now be found in the care plan section)  Progress towards OT  goals: Not progressing toward goals - comment (due to increased BP)     Plan Discharge plan remains appropriate       End of Session     Activity Tolerance  (limited by increased BP)   Patient Left in bed;with call bell/phone within reach;with bed alarm set   Nurse Communication  (BP increased)        Time: 9604-54090854-0932 OT Time Calculation (min): 38 min  Charges: OT General Charges $OT Visit: 1 Procedure OT Treatments $Self Care/Home Management : 8-22 mins $Therapeutic Exercise: 23-37 mins  Evette GeorgesLeonard, Yaire Kreher Eva [phone] 03/01/2015, 11:09 AM

## 2015-03-01 NOTE — Progress Notes (Signed)
I met with pt at bedside after assisting her to bathroom She has progressed well over the past three days. Not in need of an inpt rehab admission at this time. Recommend Home with 24/7 supervision initially and oupt rehab for OT needs. Pt states her sister in law and friends can arrange supervision for her in her home. I have alerted RN CM. We will sign off. [phone]

## 2015-03-01 NOTE — Progress Notes (Signed)
Called MD regarding BP 190/97 and he stated they will address in the morning since patient is asymptomatic. Suzy Bouchardhompson, Alessa Mazur E, RN 03/01/2015 2:19 AM

## 2015-03-02 DIAGNOSIS — I10 Essential (primary) hypertension: Secondary | ICD-10-CM

## 2015-03-02 DIAGNOSIS — E039 Hypothyroidism, unspecified: Secondary | ICD-10-CM

## 2015-03-02 LAB — BASIC METABOLIC PANEL
ANION GAP: 8 (ref 5–15)
BUN: 26 mg/dL — ABNORMAL HIGH (ref 6–20)
CO2: 28 mmol/L (ref 22–32)
Calcium: 8.8 mg/dL — ABNORMAL LOW (ref 8.9–10.3)
Chloride: 99 mmol/L — ABNORMAL LOW (ref 101–111)
Creatinine, Ser: 0.65 mg/dL (ref 0.44–1.00)
GFR calc non Af Amer: >60 mL/min (ref >60)
GLUCOSE: 122 mg/dL — AB (ref 65–99)
Potassium: 4.3 mmol/L (ref 3.5–5.1)
SODIUM: 135 mmol/L (ref 135–145)

## 2015-03-02 LAB — T4, FREE: FREE T4: 0.93 ng/dL (ref 0.61–1.12)

## 2015-03-02 MED ORDER — OXYCODONE HCL 5 MG PO TABS
5.0000 mg | ORAL_TABLET | ORAL | Status: DC | PRN
  Administered 2015-03-02 (×2): 5 mg via ORAL
  Filled 2015-03-02 (×2): qty 1

## 2015-03-02 NOTE — Progress Notes (Signed)
Patient ID: Elizabeth HenleFrances [name], female   DOB: 05-11-1941, 74 y.o.   MRN: [id number] She feels drained of energy after pt today, wants to wsit till pm to go home

## 2015-03-02 NOTE — Progress Notes (Signed)
Pt discharged at this time with her brother and his wife taking all personal belongings. IV discontinued, dry dressing applied. Discharge instructions provided with prescriptions with verbal understanding. No noted distress.

## 2015-03-02 NOTE — Progress Notes (Signed)
Occupational Therapy Treatment Patient Details Name: Elizabeth HenleFrances [name] MRN: [id number] DOB: 1940-11-14 Today's Date: 03/02/2015    History of present illness 74 y.o female, s/p anterior cervical decompression/discectomy fusion 3 levels and decompression of the median nerve in R hand. PMH significant for RSD (reflex sympathetic dystrophy), fibromyalgia, thyroid disease, lyme disease, blood clot, PMR (polymyalgia rheumatica), bilateral carpal tunnel syndrome.   OT comments  This 74 yo female continuing to make progress and is no longer a candidate for CIR. [facility] use of her RUE with BADLs and IADLs. Pt to D/C today.  Follow Up Recommendations  Home health OT;Supervision/Assistance - 24 hour    Equipment Recommendations   (none)       Precautions / Restrictions Precautions Precautions: Cervical;Fall Required Braces or Orthoses: Cervical Brace Cervical Brace: Soft collar (off when in bed and eating) Restrictions Weight Bearing Restrictions: No Other Position/Activity Restrictions: carpal tunnel release right hand       Mobility Bed Mobility Overal bed mobility: Needs Assistance Bed Mobility: Supine to Sit;Sit to Supine     Supine to sit: Supervision Sit to supine: Supervision   General bed mobility comments: Requires extra time. No physical assist needed. supervision for safety.  Transfers Overall transfer level: Needs assistance Equipment used: None Transfers: Sit to/from Stand Sit to Stand: Supervision                ADL Overall ADL's : Needs assistance/impaired                       Lower Body Dressing Details (indicate cue type and reason): min A for socks due to even though attempting to use Bil UEs she kept getting her socks stuck on her little toe (both feet); she was able to verbalize to be that when dressing her UB she should put her RUE in a shirt first (no shirt here for her to practice)                General ADL Comments: Pt able to get C-collar on by herself when she lays it on her pillow, lays down on it, hooks it in the front, then rotates it around so it is oriented correctly--cannot do it the traditional way when sitting up due to decreased use of RUE. Issued pt a RUE walker splint for her to use to help keep her RUE on the RW.                Cognition   Behavior During Therapy: WFL for tasks assessed/performed Overall Cognitive Status: No family/caregiver present to determine baseline cognitive functioning                         Exercises Other Exercises Other Exercises: Issued pt red grip shelf liner so that she can place it on a table and work on "walking" her arm with fingers forward/backward, left/right, outline of liner (rectangle), and "X"--she returned demonstrated this for me           Pertinent Vitals/ Pain       Pain Assessment: 0-10 Pain Score:  ("I'm doing okay" no value given) Pain Intervention(s): Monitored during session;Repositioned         Frequency Min 2X/week     Progress Toward Goals  OT Goals(current goals can now be found in the care plan section)  Progress towards OT goals: Progressing toward goals  Acute Rehab  OT Goals Patient Stated Goal: none stated  Plan Discharge plan needs to be updated       End of Session Equipment Utilized During Treatment: Cervical collar   Activity Tolerance  (Increased pain post session)   Patient Left in bed;with call bell/phone within reach;with nursing/sitter in room   Nurse Communication Patient requests pain meds        Time: 0981-1914 OT Time Calculation (min): 39 min  Charges: OT General Charges $OT Visit: 1 Procedure OT Treatments $Self Care/Home Management : 23-37 mins $Therapeutic Exercise: 8-22 mins  Evette Georges [phone] 03/02/2015, 4:30 PM

## 2015-03-02 NOTE — Discharge Summary (Signed)
Physician Discharge Summary  Patient ID: Elizabeth [name] MRN: [id number] DOB/AGE: 73-Aug-1942 73 y.o.  Admit date: 02/24/2015 Discharge date: 03/02/2015  Admission Diagnoses:cervical stenosis. Chronic radiculopathies. Bilateral carpal tunnel sybdrome  Discharge Diagnoses:  Active Problems:   Cervical stenosis of spinal canal   Discharged Condition: stable, no pain  Hospital Course: surgery  Consults: rehab medicine  Significant Diagnostic Studies: myelo   emg  Treatments: c4 to c7 fusion. Decompression of right median nerve  Discharge Exam: Blood pressure 132/79, pulse 72, temperature 97.8 F (36.6 C), temperature source Oral, resp. rate 16, height 5\' 5"  (1.651 m), weight 67.949 kg (149 lb 12.8 oz), SpO2 97 %. Severe weakness right deltoids and biceps  Disposition: 01-Home or Self Care       Medication List    ASK your doctor about these medications        albuterol (2.5 MG/3ML) 0.083% nebulizer solution  Commonly known as:  PROVENTIL  Take 2.5 mg by nebulization every 6 (six) hours as needed for wheezing or shortness of breath.     CALCIUM PO  Take 1 tablet by mouth daily.     citalopram 10 MG tablet  Commonly known as:  CELEXA  Take 20 mg by mouth daily.     cyclobenzaprine 10 MG tablet  Commonly known as:  FLEXERIL  Take 10 mg by mouth 3 (three) times daily as needed.     diazepam 5 MG tablet  Commonly known as:  VALIUM  Take 5 mg by mouth every 6 (six) hours as needed for anxiety.     DULoxetine 20 MG capsule  Commonly known as:  CYMBALTA  Take 1 capsule (20 mg total) by mouth 2 (two) times daily.     levothyroxine 75 MCG tablet  Commonly known as:  SYNTHROID, LEVOTHROID  Take 75 mcg by mouth daily before breakfast.     lidocaine 5 %  Commonly known as:  LIDODERM  Place 1 patch onto the skin daily. Remove & Discard patch within 12 hours or as directed by MD     ondansetron 4 MG disintegrating tablet  Commonly known as:  ZOFRAN ODT  Take 1 tablet (4  mg total) by mouth every 8 (eight) hours as needed for nausea or vomiting.     oxyCODONE-acetaminophen 5-325 MG per tablet  Commonly known as:  PERCOCET/ROXICET  Take 1-2 tablets by mouth every 4 (four) hours as needed for severe pain.     simvastatin 10 MG tablet  Commonly known as:  ZOCOR  Take 10 mg by mouth at bedtime.     verapamil 80 MG tablet  Commonly known as:  CALAN  Take 80 mg by mouth 2 (two) times daily.         Signed: Karn CassisBOTERO,Zev Blue M 03/02/2015, 3:32 PM

## 2015-03-02 NOTE — Progress Notes (Signed)
[facility]                                                                              Patient Demographics  Elizabeth [name], is a 74 y.o. female, DOB - 28-May-1941, EAV:[id number]  Admit date - 02/24/2015   Admitting Physician Hilda Lias, MD  Outpatient Primary MD for the patient is WEBB, Estill Batten, MD  LOS - 6   No chief complaint on file.     Consulted for hypertension and thyroid disease.  Assessment & Plan   Cervical stenosis -S/p ACDF -Being managed by primary team, neurosurgery -Continue steroids- would taper if possible  Hypertension -Continue verapamil and bidil -Possible elevated due to steroid-induced -Would taper steroid-induced to prevent orthostatic manifestations -Continue hydralazine as needed  Thyroid disease -TSH 0.168, FT4 0.93 -Continue synthroid  Hyperlipidemia -Continue statin  Code Status: Full  Family Communication: None at bedside  Disposition Plan: Admitted.  May be discharged today by neurosurgery.   Time Spent in minutes   30 minutes  Procedures  ACDF  Consults   TRH  DVT Prophylaxis  SCDs  Lab Results  Component Value Date   PLT 273 02/17/2015    Medications  Scheduled Meds: . citalopram  20 mg Oral Daily  . dexamethasone  4 mg Intravenous 4 times per day   Or  . dexamethasone  4 mg Oral 4 times per day  . DULoxetine  20 mg Oral BID  . isosorbide-hydrALAZINE  0.5 tablet Oral BID  . levothyroxine  75 mcg Oral QAC breakfast  . senna-docusate  1 tablet Oral BID  . simvastatin  10 mg Oral QHS  . sodium chloride  3 mL Intravenous Q12H  . verapamil  80 mg Oral BID   Continuous Infusions: . sodium chloride    . sodium chloride 75 mL/hr (02/27/15 2010)  . lactated ringers 10 mL/hr at 02/24/15 0858   PRN Meds:.acetaminophen **OR** acetaminophen, albuterol, diazepam, hydrALAZINE, HYDROmorphone (DILAUDID) injection, menthol-cetylpyridinium **OR** phenol, ondansetron (ZOFRAN) IV, ondansetron,  oxyCODONE-acetaminophen, sodium chloride  Antibiotics    Anti-infectives    Start     Dose/Rate Route Frequency Ordered Stop   02/24/15 1800  ceFAZolin (ANCEF) IVPB 1 g/50 mL premix     1 g 100 mL/hr over 30 Minutes Intravenous Every 8 hours 02/24/15 1710 02/25/15 0302   02/24/15 1300  ceFAZolin (ANCEF) IVPB 2 g/50 mL premix     2 g 100 mL/hr over 30 Minutes Intravenous To Surgery 02/23/15 0944 02/24/15 1150      Subjective:   [name] seen and examined today.  Patient states she is going home today. She denies chest pain, shortness of breath, abdominal pain, dizziness, headache.  Objective:   Filed Vitals:   03/01/15 1733 03/01/15 2300 03/02/15 0141 03/02/15 0532  BP: 150/78 170/78 135/69 155/95  Pulse: 77 68 72 64  Temp: 97.7 F (36.5 C) 97.9 F (36.6 C) 97.7 F (36.5 C) 97.8 F (36.6 C)  TempSrc: Oral Oral Oral Oral  Resp: Height:      Weight:      SpO2: 98% 97% 97% 98%    Wt Readings from Last 3 Encounters:  02/24/15 67.949  kg (149 lb 12.8 oz)  07/23/14 69.854 kg (154 lb)  04/24/14 66.225 kg (146 lb)     Intake/Output Summary (Last 24 hours) at 03/02/15 16100822 Last data filed at 03/01/15 1733  Gross per 24 hour  Intake    480 ml  Output      2 ml  Net    478 ml    Exam  General: Well developed, well nourished, NAD, appears stated age  HEENT: NCAT, mucous membranes moist.   Cardiovascular: S1 S2 auscultated, RRR.  Respiratory: Clear to auscultation bilaterally   Abdomen: Soft, nontender, nondistended, + bowel sounds  Extremities: warm dry without cyanosis clubbing or edema  Neuro: AAOx3, nonfocal  Psych: Normal affect and demeanor, pleasant  Data Review   Micro Results No results found for this or any previous visit (from the past 240 hour(s)).  Radiology Reports Ct Abdomen Pelvis Wo Contrast  02/05/2015   CLINICAL DATA:  Acute onset of generalized abdominal pain and vomiting. Initial encounter.  EXAM: CT ABDOMEN AND  PELVIS WITHOUT CONTRAST  TECHNIQUE: Multidetector CT imaging of the abdomen and pelvis was performed following the standard protocol without IV contrast.  COMPARISON:  MRI of the lumbar spine performed 09/26/2014, and CT of the abdomen and pelvis from 01/07/2012  FINDINGS: The visualized lung bases are clear.  A 2.9 cm cyst is noted at the hepatic dome, relatively stable from 2013. Smaller nonspecific hypodensities are seen within the inferior tip of the liver. The liver and spleen are otherwise unremarkable. The patient is status post cholecystectomy. Nodularity about the proximal aspect of the pancreatic body is relatively stable from 2013 and is thought to reflect a normal variant, without a definite mass. The adrenal glands are within normal limits.  Mild focal left renal scarring is noted. There is mild left-sided pelvicaliectasis, without evidence of distal obstructing stone or significant hydronephrosis. The right kidney is unremarkable in appearance. No renal or ureteral stones are seen. No perinephric stranding is appreciated.  No free fluid is identified. The small bowel is unremarkable in appearance. The stomach is within normal limits. No acute vascular abnormalities are seen. Minimal calcification is seen along the abdominal aorta. An IVC filter is noted overlying expected position. It is slightly inferior, but still within normal limits.  The appendix is not definitely seen; there is no evidence of appendicitis.  Mild soft tissue inflammation and trace fluid are seen along the ascending colon. This may surround a large lobule of fat, and could reflect epiploic appendagitis or possibly a small omental infarct, though the fat itself does not demonstrate significant inflammation. The colon is otherwise unremarkable.  The bladder is mildly distended and grossly unremarkable in appearance. The uterus is grossly unremarkable. The ovaries are relatively symmetric. Trace free fluid within the pelvis may arise  from the right mid abdominal process. No inguinal lymphadenopathy is seen.  Prominent collateral venous vasculature is noted anteriorly at the level of the pelvis.  No acute osseous abnormalities are identified. The patient is status post vertebroplasty at L5. There is chronic compression deformity involving vertebral bodies L1, L4 and L5, with mild underlying facet disease.  IMPRESSION: 1. Mild soft tissue inflammation and trace fluid noted along the ascending colon. The underlying colon is grossly unremarkable. This may surround a large lobule of fat, and could reflect epiploic appendagitis or possibly a small omental infarct, though the fat itself does not demonstrate significant inflammation. 2. Trace fluid within the pelvis may arise from the right mid abdominal process.  3. Nonspecific hypodensities within the inferior tip of the liver. Relatively stable 2.9 cm cyst noted at the hepatic dome. 4. Mild focal left renal scarring. Mild left-sided renal pelvicaliectasis remains within normal limits, without evidence of hydronephrosis. 5. Prominent venous collateral vasculature noted anteriorly at the level of the pelvis.   Electronically Signed   By: Roanna Raider M.D.   On: 02/05/2015 22:36   Dg Cervical Spine 1 View  02/26/2015   CLINICAL DATA:  Post cervical spine fusion on 02/24/2015  EXAM: DG CERVICAL SPINE - 1 VIEW  COMPARISON:  02/24/2015  FINDINGS: Anterior plate and screws from C4-C7 post anterior fusion.  Bone plugs are present at the intervening C4-C5, C5-C6 and C6-C7 disc spaces.  Vertebral body heights maintained without fracture or subluxation.  Disc space narrowing with endplate spur formation C3-C4.  Prevertebral soft tissue swelling consistent with preceding surgery.  Bones appear demineralized.  IMPRESSION: Post anterior fusion C4-C7.   Electronically Signed   By: Ulyses Southward M.D.   On: 02/26/2015 13:21   Dg Cervical Spine 2-3 Views  02/24/2015   CLINICAL DATA:  ACDF at C4-5-6-7.  EXAM:  CERVICAL SPINE - 2-3 VIEW  COMPARISON:  01/24/2015  FINDINGS: The series of 6 intraoperative images of the cervical spine demonstrate level localization, for example on image # 2 there is needle probe localization at the C4-5 intervertebral level.  On image # 3, a plate and screw fixator is visible at C4-C5-C6 and perhaps extending further caudad, with interbody graft/ spacer visible at the C4-5 level. The amount of threaded screw capture of the vertebra is difficult to be certain of as the anterior cortical margins are mildly indistinct.  IMPRESSION: 1. ACDF with anterior plate at Z6-X0-R6 and possibly extending down to C7, although obscured by the patient's shoulders in its lower portion.   Electronically Signed   By: Gaylyn Rong M.D.   On: 02/24/2015 16:33    CBC No results for input(s): WBC, HGB, HCT, PLT, MCV, MCH, MCHC, RDW, LYMPHSABS, MONOABS, EOSABS, BASOSABS, BANDABS in the last 168 hours.  Invalid input(s): NEUTRABS, BANDSABD  Chemistries  No results for input(s): NA, K, CL, CO2, GLUCOSE, BUN, CREATININE, CALCIUM, MG, AST, ALT, ALKPHOS, BILITOT in the last 168 hours.  Invalid input(s): GFRCGP ------------------------------------------------------------------------------------------------------------------ estimated creatinine clearance is 55.1 mL/min (by C-G formula based on Cr of 0.83). ------------------------------------------------------------------------------------------------------------------ No results for input(s): HGBA1C in the last 72 hours. ------------------------------------------------------------------------------------------------------------------ No results for input(s): CHOL, HDL, LDLCALC, TRIG, CHOLHDL, LDLDIRECT in the last 72 hours. ------------------------------------------------------------------------------------------------------------------  Recent Labs  03/01/15 1947  TSH 0.168*    ------------------------------------------------------------------------------------------------------------------ No results for input(s): VITAMINB12, FOLATE, FERRITIN, TIBC, IRON, RETICCTPCT in the last 72 hours.  Coagulation profile No results for input(s): INR, PROTIME in the last 168 hours.  No results for input(s): DDIMER in the last 72 hours.  Cardiac Enzymes No results for input(s): CKMB, TROPONINI, MYOGLOBIN in the last 168 hours.  Invalid input(s): CK ------------------------------------------------------------------------------------------------------------------ Invalid input(s): POCBNP    Joany Khatib D.O. on 03/02/2015 at 8:22 AM  Between 7am to 7pm - Pager - [phone]  After 7pm go to [email or web address] - password [email or web address]  And look for the night coverage person covering for me after hours  [facility] Office  [phone]

## 2015-03-02 NOTE — Progress Notes (Signed)
Physical Therapy Treatment Patient Details Name: Elizabeth Elizabeth [name] MRN: [id number] DOB: 23-Jan-1941 Today'Elizabeth [name] Date: 03/02/2015    History of Present Illness 74 y.o female, Elizabeth [name]/p anterior cervical decompression/discectomy fusion 3 levels and decompression of the median nerve in R hand. PMH significant for RSD (reflex sympathetic dystrophy), fibromyalgia, thyroid disease, lyme disease, blood clot, PMR (polymyalgia rheumatica), bilateral carpal tunnel syndrome.    PT Comments    Progressing well towards physical therapy goals. Ambulates generally well with and without an assistive device today up to 340 feet, no loss of balance, using RUE as able to grip RW. Feel she is adequate for d/c from a mobility standpoint when medically ready.  Follow Up Recommendations  Home health PT;Supervision/Assistance - 24 hour     Equipment Recommendations  Rolling walker with 5" wheels    Recommendations for Other Services       Precautions / Restrictions Precautions Precautions: Cervical;Fall Required Braces or Orthoses: Cervical Brace Cervical Brace: Soft collar (off in bed and when eating) Restrictions Weight Bearing Restrictions: No Other Position/Activity Restrictions: carpal tunnel release right hand    Mobility  Bed Mobility Overal bed mobility: Needs Assistance Bed Mobility: Supine to Sit     Supine to sit: Supervision     General bed mobility comments: Requires extra time. No physical assist needed. supervision for safety.  Transfers Overall transfer level: Needs assistance Equipment used: None Transfers: Sit to/from Stand Sit to Stand: Supervision         General transfer comment: supervision for safety. no assist required. Minor instability but able to self correct without assist.  Ambulation/Gait Ambulation/Gait assistance: Supervision Ambulation Distance (Feet): 340 Feet Assistive device: Rolling walker (2 wheeled);None Gait Pattern/deviations: Step-through pattern;Decreased  stride length Gait velocity: decreased   General Gait Details: Ambulates generally well. Slightly guarded. VC for cervical precautions intermittently. Able to maintain RUE on RW and use minimally for RW control but compensates well with LUE. No loss of balance noted. able to ambulate backwards and up to 60 feet without an assitive device.   Stairs Stairs:  (states she does not have any stairs at home)          Wheelchair Mobility    Modified Rankin (Stroke Patients Only)       Balance                                    Cognition Arousal/Alertness: Awake/alert Behavior During Therapy: WFL for tasks assessed/performed Overall Cognitive Status: History of cognitive impairments - at baseline                      Exercises      General Comments        Pertinent Vitals/Pain Pain Assessment: 0-10 Pain Score:  ("I'm doing okay" no value given) Pain Intervention(Elizabeth [name]): Monitored during session;Repositioned    Home Living                      Prior Function            PT Goals (current goals can now be found in the care plan section) Acute Rehab PT Goals Patient Stated Goal: none stated PT Goal Formulation: With patient Time For Goal Achievement: 03/11/15 Potential to Achieve Goals: Good Progress towards PT goals: Progressing toward goals    Frequency  Min 5X/week    PT Plan Discharge plan needs to be updated  Co-evaluation             End of Session Equipment Utilized During Treatment: Cervical collar Activity Tolerance: Patient tolerated treatment well;No increased pain Patient left: with call bell/phone within reach;in bed;with bed alarm set     Time: [id number] PT Time Calculation (min) (ACUTE ONLY): 12 min  Charges:  $Gait Training: 8-22 mins                    G Codes:      Elizabeth Elizabeth [name], Elizabeth Elizabeth [name] 03/02/2015, 3:05 PM Elizabeth Elizabeth [name], Elizabeth CarolinaPT [phone]

## 2015-03-09 ENCOUNTER — Encounter: Payer: Self-pay | Admitting: Physical Therapy

## 2015-03-09 ENCOUNTER — Ambulatory Visit: Payer: Medicare Other | Attending: Neurosurgery | Admitting: Physical Therapy

## 2015-03-09 DIAGNOSIS — R29898 Other symptoms and signs involving the musculoskeletal system: Secondary | ICD-10-CM | POA: Insufficient documentation

## 2015-03-09 DIAGNOSIS — Z9889 Other specified postprocedural states: Secondary | ICD-10-CM | POA: Insufficient documentation

## 2015-03-09 DIAGNOSIS — M542 Cervicalgia: Secondary | ICD-10-CM | POA: Diagnosis not present

## 2015-03-09 NOTE — Therapy (Addendum)
[facility] [address] [place], [place], [address] Phone: [phone]   Fax:  [phone]  Physical Therapy Evaluation  Patient Details  Name: Elizabeth [name] MRN: [id number] Date of Birth: September 15, 1941 Referring Provider:  Leeroy Cha, MD  Encounter Date: 03/09/2015      PT End of Session - 03/09/15 1214    Visit Number 1   Number of Visits 10  Medicare   Date for PT Re-Evaluation 05/04/15   PT Start Time 0630   PT Stop Time 1220   PT Time Calculation (min) 35 min   Activity Tolerance Patient tolerated treatment well   Behavior During Therapy [facility] for tasks assessed/performed      Past Medical History  Diagnosis Date  . Pulmonary embolus   . Lyme disease   . Blood clot in vein   . RSD (reflex sympathetic dystrophy)   . Fibromyalgia   . PMR (polymyalgia rheumatica)   . Thyroid disease   . Hypertension   . Arthritis   . Asthma   . Headache     H/O migraines  . Colitis     appendecticitis    Past Surgical History  Procedure Laterality Date  . Cholecystectomy    . Back surgery    . Eye surgery      Lasik  . Ivc filter    . Thrombectomy / embolectomy vena cava    . Appendectomy    . Dilation and curettage of uterus    . Anterior cervical decomp/discectomy fusion N/A 02/24/2015    Procedure: Cervical four-five cervical five-six cervical six- seven anterior cervical decompression/ diskectomy/ fusion;  Surgeon: Leeroy Cha, MD;  Location: [facility];  Service: Neurosurgery;  Laterality: N/A;  C4-5 C5-6 C6-7 Anterior cervical decompression/diskectomy/fusion  . Carpal tunnel release Right 02/24/2015    Procedure: RIGHT CARPAL TUNNEL RELEASE;  Surgeon: Leeroy Cha, MD;  Location: [facility];  Service: Neurosurgery;  Laterality: Right;  Right Carpal tunnel release    There were no vitals filed for this visit.  Visit Diagnosis:  Weakness of right upper extremity - Plan: PT plan of care  cert/re-cert      Subjective Assessment - 03/09/15 1154    Subjective Patient reports 3 years she climbed a 6 foot fence and had back surgery. Patient reports cervical surgery on 02/24/2015 and discharged on 03/06/2015. Patietn is s/p C4-5, C5-6, C6-7 for anterior disectomy, decompression and fusion.  S/P carpal tunnel surgery on right on 02/24/2015.  Patient is unable to use her right arm  but has no cervical pain.  Surgical site on cervical is healing.    Patient Stated Goals be able to use right arm   Currently in Pain? No/denies            [facility] PT Assessment - 03/09/15 0001    Assessment   Medical Diagnosis M54.2 Cervicalgia   Onset Date/Surgical Date 02/24/15  Hospitalized 02/24/2015-03/06/2015   Next MD Visit 03/11/2015   Prior Therapy None   Precautions   Precautions Cervical   Precaution Comments No joint mobilization   Balance Screen   Has the patient fallen in the past 6 months No   Has the patient had a decrease in activity level because of a fear of falling?  No   Is the patient reluctant to leave their home because of a fear of falling?  No   Prior Function   Level of Independence Needs assistance with ADLs;Needs assistance with homemaking  Observation/Other Assessments   Observations surgical site  is healing   Focus on Therapeutic Outcomes (FOTO)  33% limitation  goal 24% limitation   Sensation   Light Touch Appears Intact   AROM   Cervical Extension decreased by 75%   Cervical - Right Side Bend decreased by 25%   Cervical - Left Side Bend decreased by 25%   Cervical - Right Rotation decreased by 25%   Cervical - Left Rotation decreased by 25%   Strength   Right/Left Shoulder Right   Right Shoulder Flexion 1/5   Right Shoulder Extension 2+/5   Right Shoulder ABduction 1/5   Right Shoulder Internal Rotation 2-/5   Right Shoulder External Rotation 0/5   Right Elbow Flexion 0/5   Right Elbow Extension 2-/5   Right Forearm Pronation 2+/5   Right Forearm  Supination 0/5   Right Wrist Flexion 3/5   Right Wrist Extension 3/5   Right Wrist Radial Deviation 4/5   Right Wrist Ulnar Deviation 4/5   Right Hand Grip (lbs) 15  left 40#                           PT Education - 03/09/15 1218    Education provided No          PT Short Term Goals - 03/09/15 1227    PT SHORT TERM GOAL #1   Title grip strength >/= 20 pounds   Time 4   Period Weeks   Status New   PT SHORT TERM GOAL #2   Title right shoulder strength >/= 2-/5 so she is able to perform daily activities easier   Time 4   Period Weeks   Status New   PT SHORT TERM GOAL #3   Title right elbow strength >/= 2/5 so she is able to bend her right elbow to eat easier   Time 4   Period Weeks   Status New   PT SHORT TERM GOAL #4   Title dressing >/= 25% greater ease due to increased strength   Time 4   Period Weeks   Status New           PT Long Term Goals - 03/09/15 1229    PT LONG TERM GOAL #1   Title dressing herselft >/= 50% greater ease due to increased right upper extremity strength    Time 8   Period Weeks   Status New   PT LONG TERM GOAL #2   Title reaching to 100 degrees shoulder flexion due to increased strength   Time 8   Period Weeks   Status New   PT LONG TERM GOAL #3   Title right shoulder strength >/= 3/5 so daily activities are easier   Time 8   Period Weeks   Status New   PT LONG TERM GOAL #4   Title right elbow strength >/= 3+/5 so it is easier to perform household chores   Time 8   Period Weeks   Status New   PT LONG TERM GOAL #5   Title grip strength >/= 30 pounds so it is easier to grip items   Time 8   Period Weeks   Status New               Plan - 03/09/15 1219    Clinical Impression Statement Patient is a 74 year old female s/p right carpal tunnel surgery, s/p C4-5, C5-6, C6-7 anterior disectomy, decompression, and  fusion on 02/24/2015.  Patient was in the hospital from 02/24/2015 to 03/06/2015.  Patient has  diagnosis of Cervicalgia.  Patient cervical ROM is limited by 25%.  Patient right shoulder strength is 1/5.  Right elbow strength is 2/5 for extension and 0/5 for flexion, 0/5 for supination, 3/5 for pronation, and wrist 3/5, and grip strength is 15 pounds. Patient has no active movement of right shoulder therefore ROM was not measured. She has full functional PROM Patient reported after surgery she was unable to use her right arm for activity, reaching, household chores and daily activities. FOTO score is 33% limitation.  Surgical site on cervical and right hand are healing. Patientwould benefit from physical therapy to increase right upper extremity strength so she can return to her daily activities.    Pt will benefit from skilled therapeutic intervention in order to improve on the following deficits Decreased activity tolerance;Decreased endurance;Decreased range of motion;Decreased strength   Rehab Potential Good   Clinical Impairments Affecting Rehab Potential None   PT Frequency 2x / week   PT Duration 8 weeks   PT Treatment/Interventions Electrical Stimulation;Therapeutic activities;Therapeutic exercise;Neuromuscular re-education;Manual techniques;Scar mobilization;Passive range of motion;Splinting;Taping   PT Next Visit Plan PROM and AAROM for right upper extremity, See what MD says (03/11/2015)   PT Home Exercise Plan exercises for right upper extremity for ROM, isometrics   Recommended Other Services None   Consulted and Agree with Plan of Care Patient          G-Codes - 2015/03/20 1231    Functional Assessment Tool Used FOTO score is 33% limitation  goal is 24%   Functional Limitation Carrying, moving and handling objects   Carrying, Moving and Handling Objects Current Status (Y6503) At least 20 percent but less than 40 percent impaired, limited or restricted   Carrying, Moving and Handling Objects Goal Status (T4656) At least 20 percent but less than 40 percent impaired, limited or  restricted     Discharge G-code is CJ due to patient only coming to the initial evaluation. FOTO score stays the same at 33% limitation.  Earlie Counts, PT 05/19/2015 2:05 PM    Problem List Patient Active Problem List   Diagnosis Date Noted  . Cervical stenosis of spinal canal 02/24/2015  . Sacroiliac dysfunction 08/26/2013  . Lumbosacral spondylosis without myelopathy 04/19/2012  . Chronic pain syndrome 04/19/2012  . HYPERTENSION 04/05/2010  . DEEP VEIN THROMBOSIS/PHLEBITIS 04/05/2010  . ASTHMA 04/05/2010  . BRONCHITIS NOT SPECIFIED AS ACUTE OR CHRONIC 04/04/2010  . GERD 04/04/2010    Debera Sterba,PT 03/20/15, 12:33 PM  [facility] [address], [place], [place], [address] Phone: [phone]   Fax:  [phone]   PHYSICAL THERAPY DISCHARGE SUMMARY  Visits from Start of Care: 1  Current functional level related to goals / functional outcomes: Unable to assess patient due to her only coming for the initial evaluation.    Remaining deficits: See above.   Education / Equipment: HEP Plan:                                                    Patient goals were not met. Patient is being discharged due to not returning since the last visit. Thank you for the referral. Earlie Counts, PT 05/19/2015 2:07 PM   ?????

## 2015-03-11 DIAGNOSIS — M542 Cervicalgia: Secondary | ICD-10-CM | POA: Diagnosis not present

## 2015-03-23 DIAGNOSIS — G8929 Other chronic pain: Secondary | ICD-10-CM | POA: Diagnosis not present

## 2015-03-23 DIAGNOSIS — L304 Erythema intertrigo: Secondary | ICD-10-CM | POA: Diagnosis not present

## 2015-03-23 DIAGNOSIS — R609 Edema, unspecified: Secondary | ICD-10-CM | POA: Diagnosis not present

## 2015-04-02 ENCOUNTER — Ambulatory Visit (HOSPITAL_BASED_OUTPATIENT_CLINIC_OR_DEPARTMENT_OTHER)
Admission: RE | Admit: 2015-04-02 | Discharge: 2015-04-02 | Disposition: A | Discharge status: Home / Self Care | Payer: Medicare Other | Source: Ambulatory Visit | Attending: Family Medicine | Admitting: Family Medicine

## 2015-04-02 ENCOUNTER — Other Ambulatory Visit (HOSPITAL_COMMUNITY): Payer: Self-pay | Admitting: Family Medicine

## 2015-04-02 ENCOUNTER — Other Ambulatory Visit: Payer: Self-pay | Admitting: Family Medicine

## 2015-04-02 ENCOUNTER — Ambulatory Visit (HOSPITAL_COMMUNITY)
Admission: RE | Admit: 2015-04-02 | Discharge: 2015-04-02 | Disposition: A | Discharge status: Home / Self Care | Payer: Medicare Other | Source: Ambulatory Visit | Attending: Family Medicine | Admitting: Family Medicine

## 2015-04-02 DIAGNOSIS — R600 Localized edema: Secondary | ICD-10-CM

## 2015-04-02 DIAGNOSIS — I82402 Acute embolism and thrombosis of unspecified deep veins of left lower extremity: Secondary | ICD-10-CM

## 2015-04-02 DIAGNOSIS — I82401 Acute embolism and thrombosis of unspecified deep veins of right lower extremity: Secondary | ICD-10-CM | POA: Diagnosis not present

## 2015-04-02 DIAGNOSIS — F419 Anxiety disorder, unspecified: Secondary | ICD-10-CM | POA: Diagnosis not present

## 2015-04-02 DIAGNOSIS — M799 Soft tissue disorder, unspecified: Secondary | ICD-10-CM

## 2015-04-02 DIAGNOSIS — R609 Edema, unspecified: Secondary | ICD-10-CM | POA: Diagnosis not present

## 2015-04-02 DIAGNOSIS — M7989 Other specified soft tissue disorders: Secondary | ICD-10-CM | POA: Diagnosis not present

## 2015-04-02 NOTE — Progress Notes (Signed)
VASCULAR LAB PRELIMINARY  PRELIMINARY  PRELIMINARY  PRELIMINARY  Right upper extremity venous duplex and Left lower extremity venous duplex completed.    Preliminary report:   1.  Upper:  No evidence of DVT or superficial thrombosis in the right upper extremity.  2.Lower:  Left:  No evidence of DVT or superficial thrombosis.  Baker's cyst noted in left popliteal fossa.  Elizabeth [name], RVT 04/02/2015, 12:50 PM

## 2015-04-05 ENCOUNTER — Other Ambulatory Visit: Payer: Medicare Other

## 2015-04-14 DIAGNOSIS — R399 Unspecified symptoms and signs involving the genitourinary system: Secondary | ICD-10-CM | POA: Diagnosis not present

## 2015-04-15 DIAGNOSIS — M25511 Pain in right shoulder: Secondary | ICD-10-CM | POA: Diagnosis not present

## 2015-04-15 DIAGNOSIS — M542 Cervicalgia: Secondary | ICD-10-CM | POA: Diagnosis not present

## 2015-04-15 DIAGNOSIS — M5412 Radiculopathy, cervical region: Secondary | ICD-10-CM | POA: Diagnosis not present

## 2015-04-15 DIAGNOSIS — M25531 Pain in right wrist: Secondary | ICD-10-CM | POA: Diagnosis not present

## 2015-04-26 DIAGNOSIS — M5412 Radiculopathy, cervical region: Secondary | ICD-10-CM | POA: Diagnosis not present

## 2015-04-28 DIAGNOSIS — M5412 Radiculopathy, cervical region: Secondary | ICD-10-CM | POA: Diagnosis not present

## 2015-05-07 DIAGNOSIS — M5412 Radiculopathy, cervical region: Secondary | ICD-10-CM | POA: Diagnosis not present

## 2015-05-10 DIAGNOSIS — M5412 Radiculopathy, cervical region: Secondary | ICD-10-CM | POA: Diagnosis not present

## 2015-05-14 DIAGNOSIS — M5412 Radiculopathy, cervical region: Secondary | ICD-10-CM | POA: Diagnosis not present

## 2015-05-21 DIAGNOSIS — M5412 Radiculopathy, cervical region: Secondary | ICD-10-CM | POA: Diagnosis not present

## 2015-05-24 IMAGING — CR DG LUMBAR SPINE COMPLETE 4+V
6 series · 6 of 6 positions shown · non-contrast
Comparison: 03/08/2012

CLINICAL DATA: Fall 3 days ago.  Low back pain.

LUMBAR SPINE - COMPLETE 4+ VIEW

[t l-spine a.p.]
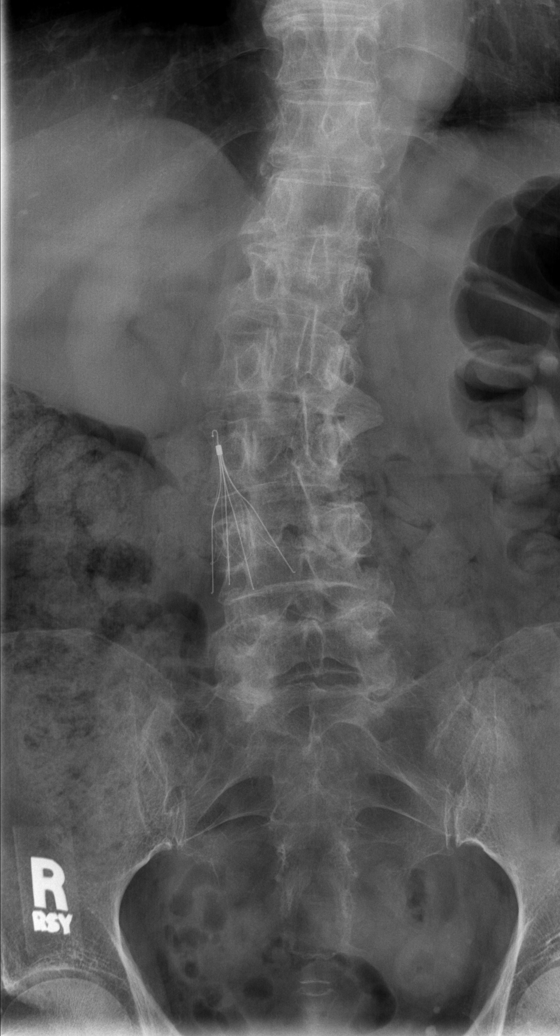

[t l-spine oblique exposure (1 of 3)]
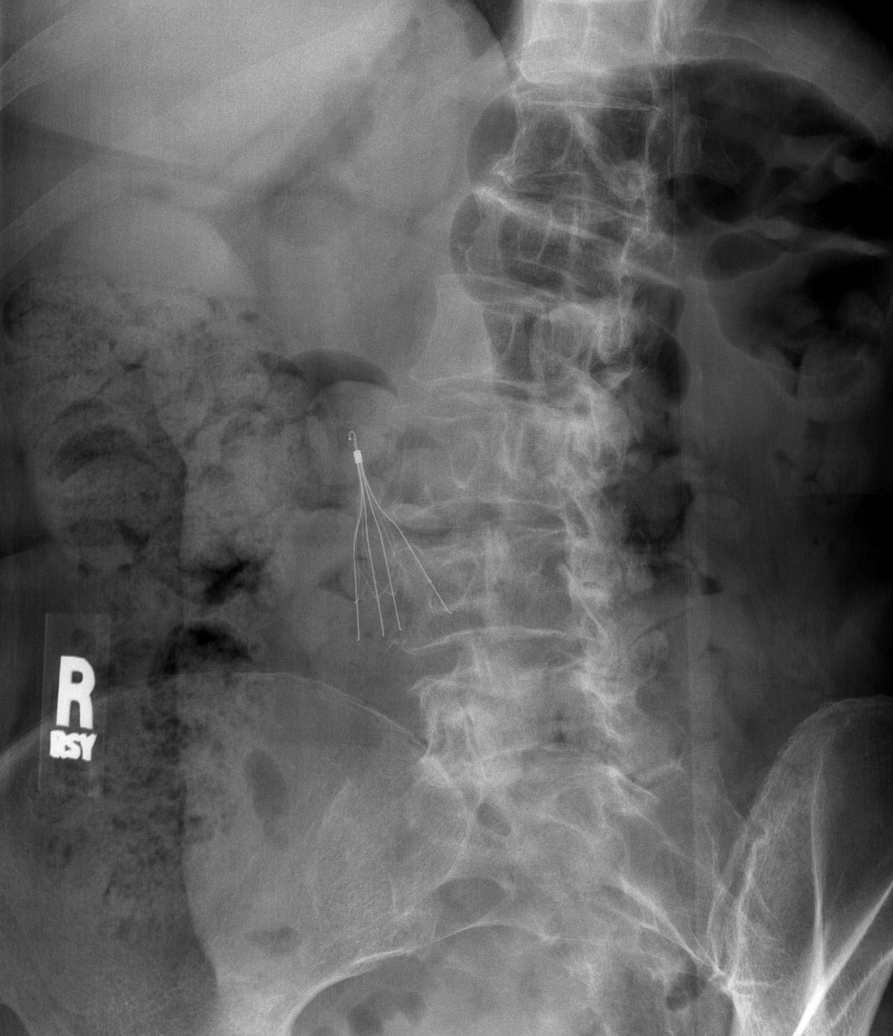

[t l-spine oblique exposure (2 of 3)]
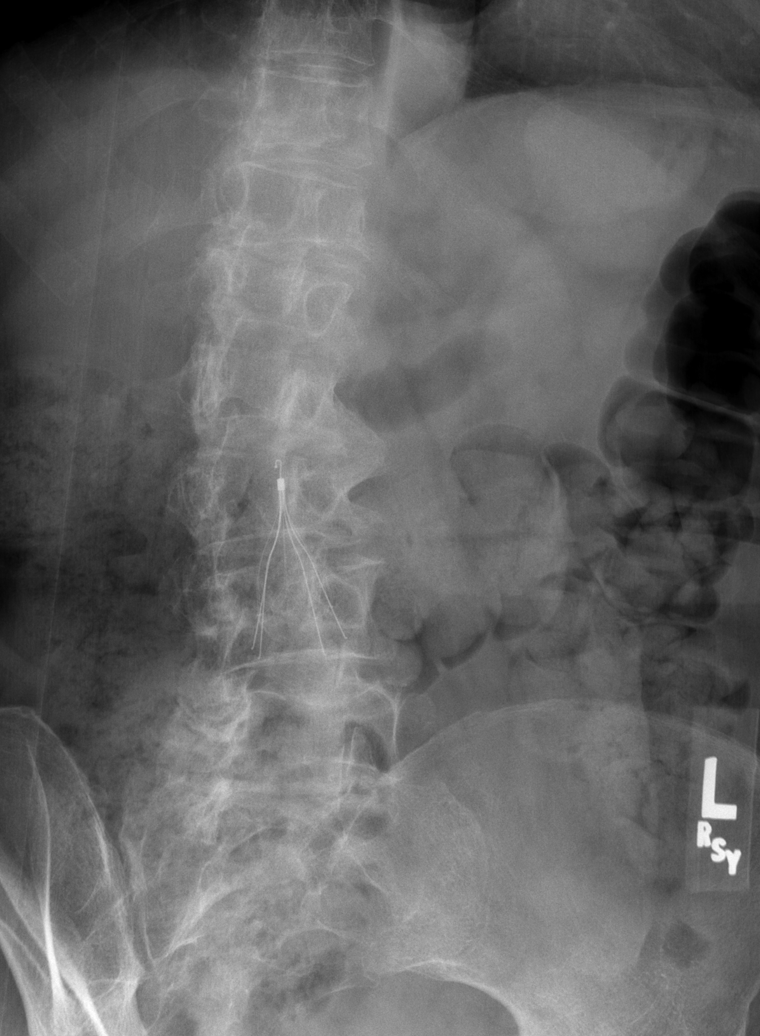

[t l-spine oblique exposure (3 of 3)]
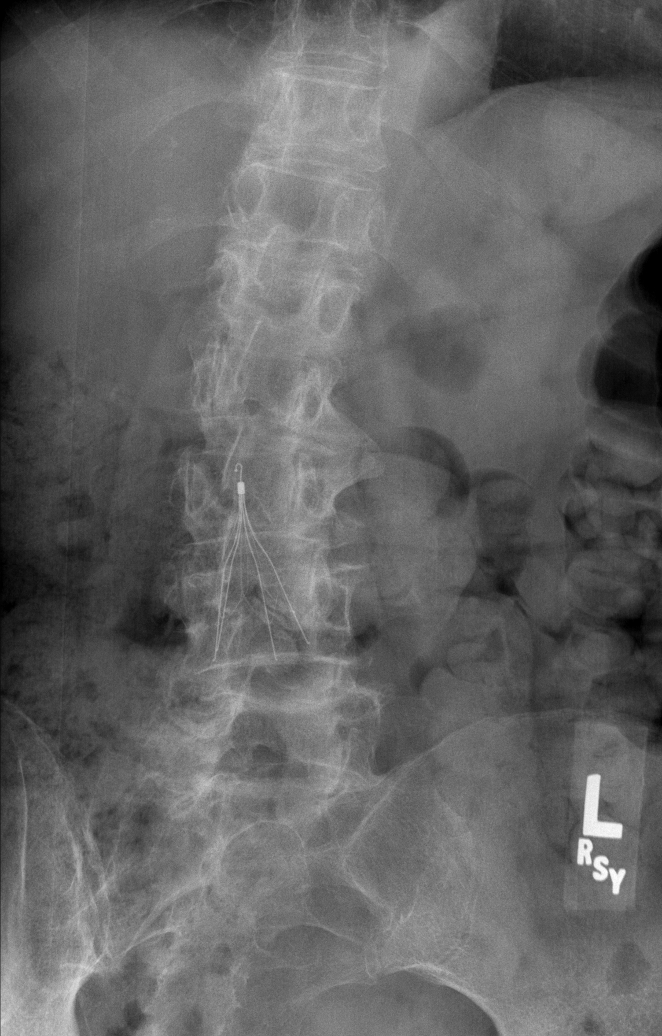

[t l-spine lat]
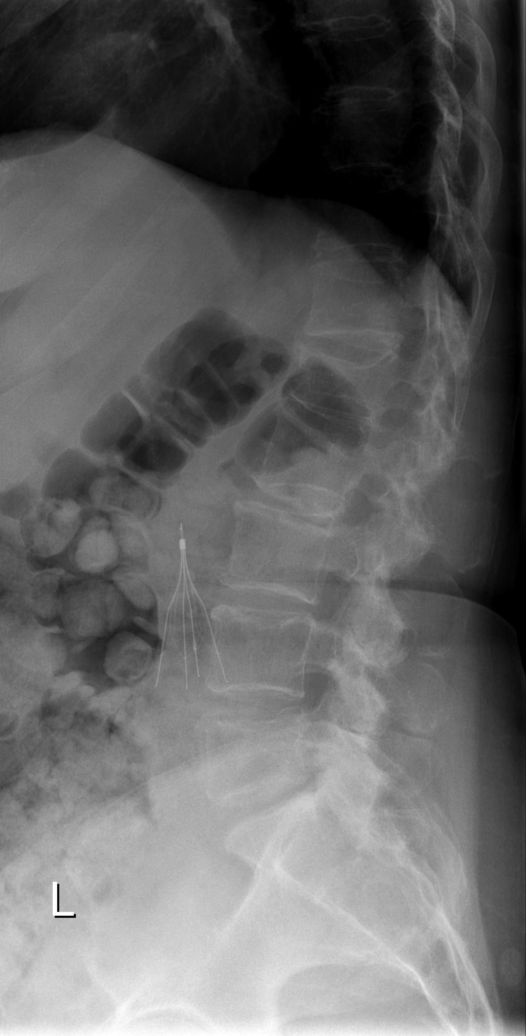

[t l-spine l5-s1 spot]
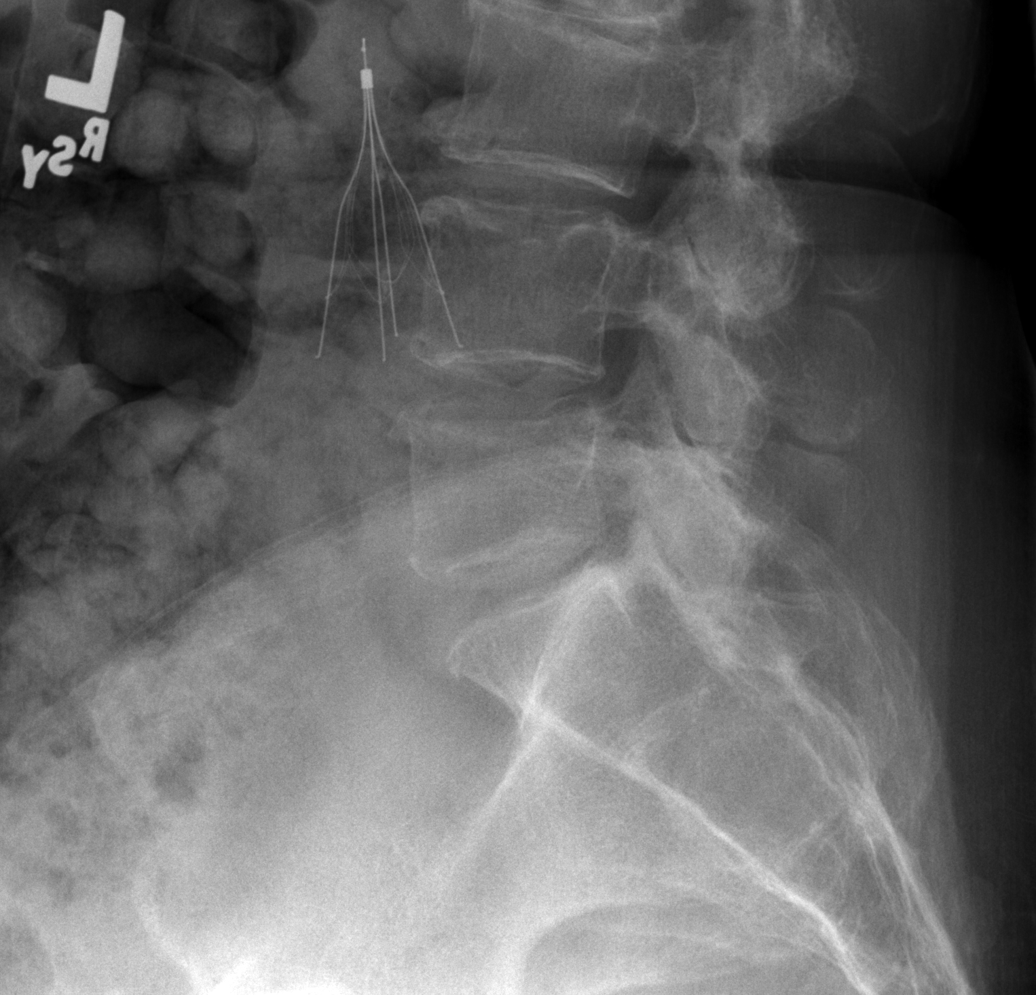

[6 of 6 positions shown; findings below may reference images not displayed]

FINDINGS: There are old minor compression deformities at L1 and L4.
There is a newly seen superior endplate compression fracture at L5.
There is disc space narrowing and facet degeneration in the lumbar
spine.  There is curvature convex to the right.  IVC filter is in
place.
IMPRESSION: Newly seen superior endplate compression fracture at L5 with loss
of height of only about 20%.  Old appearing compression deformities
at L1 and L4 as seen in 4806.

## 2015-05-26 DIAGNOSIS — F330 Major depressive disorder, recurrent, mild: Secondary | ICD-10-CM | POA: Diagnosis not present

## 2015-05-26 DIAGNOSIS — F419 Anxiety disorder, unspecified: Secondary | ICD-10-CM | POA: Diagnosis not present

## 2015-05-26 DIAGNOSIS — G894 Chronic pain syndrome: Secondary | ICD-10-CM | POA: Diagnosis not present

## 2015-06-18 ENCOUNTER — Encounter: Payer: Medicare Other | Attending: Physical Medicine & Rehabilitation

## 2015-06-18 ENCOUNTER — Ambulatory Visit (HOSPITAL_BASED_OUTPATIENT_CLINIC_OR_DEPARTMENT_OTHER): Payer: Medicare Other | Admitting: Physical Medicine & Rehabilitation

## 2015-06-18 ENCOUNTER — Encounter: Payer: Self-pay | Admitting: Physical Medicine & Rehabilitation

## 2015-06-18 VITALS — BP 139/87 | HR 75 | Resp 14

## 2015-06-18 DIAGNOSIS — M5412 Radiculopathy, cervical region: Secondary | ICD-10-CM

## 2015-06-18 DIAGNOSIS — M47817 Spondylosis without myelopathy or radiculopathy, lumbosacral region: Secondary | ICD-10-CM | POA: Diagnosis not present

## 2015-06-18 DIAGNOSIS — J45909 Unspecified asthma, uncomplicated: Secondary | ICD-10-CM | POA: Diagnosis not present

## 2015-06-18 DIAGNOSIS — I80299 Phlebitis and thrombophlebitis of other deep vessels of unspecified lower extremity: Secondary | ICD-10-CM | POA: Insufficient documentation

## 2015-06-18 DIAGNOSIS — M7062 Trochanteric bursitis, left hip: Secondary | ICD-10-CM | POA: Insufficient documentation

## 2015-06-18 DIAGNOSIS — M353 Polymyalgia rheumatica: Secondary | ICD-10-CM | POA: Insufficient documentation

## 2015-06-18 DIAGNOSIS — M7061 Trochanteric bursitis, right hip: Secondary | ICD-10-CM | POA: Diagnosis not present

## 2015-06-18 DIAGNOSIS — G9050 Complex regional pain syndrome I, unspecified: Secondary | ICD-10-CM | POA: Insufficient documentation

## 2015-06-18 DIAGNOSIS — I10 Essential (primary) hypertension: Secondary | ICD-10-CM | POA: Insufficient documentation

## 2015-06-18 NOTE — Progress Notes (Signed)
Subjective:    Patient ID: Elizabeth [name], female    DOB: Feb 27, 1941, 74 y.o.   MRN: [id number]  HPI  74 year old female with chronic low back pain. She was last seen by me in February 2016 at which time we did bilateral L3-L4 medial branch blocks and L5 dorsal ramus injection. This was very helpful for the patient however her pain has gradually returned.  Pain returned about 6 weeks ago.  Interval medical history is significant for C4-C7 fusion was hospitalized from June 1 to 03/02/2015. At the same time she had bilateral carpal tunnel release. She has had some outpatient therapy Pain Inventory Average Pain 8 Pain Right Now 6 My pain is sharp, dull and stabbing  In the last 24 hours, has pain interfered with the following? General activity 5 Relation with others 5 Enjoyment of life 5 What TIME of day is your pain at its worst? all Sleep (in general) Poor  Pain is worse with: unsure Pain improves with: medication Relief from Meds: 7  Mobility walk without assistance do you drive?  no  Function disabled: date disabled .  Neuro/Psych spasms  Prior Studies Any changes since last visit?  no  Physicians involved in your care Any changes since last visit?  no   Family History  Problem Relation Age of Onset  . Cancer Daughter    Social History   Social History  . Marital Status: Widowed    Spouse Name: N/A  . Number of Children: N/A  . Years of Education: N/A   Social History Main Topics  . Smoking status: Never Smoker   . Smokeless tobacco: Never Used  . Alcohol Use: 0.0 oz/week    0 Standard drinks or equivalent per week     Comment: rare glass of wine  . Drug Use: No  . Sexual Activity: Not Asked   Other Topics Concern  . None   Social History Narrative   Past Surgical History  Procedure Laterality Date  . Cholecystectomy    . Back surgery    . Eye surgery      Lasik  . Ivc filter    . Thrombectomy / embolectomy vena cava    . Appendectomy      . Dilation and curettage of uterus    . Anterior cervical decomp/discectomy fusion N/A 02/24/2015    Procedure: Cervical four-five cervical five-six cervical six- seven anterior cervical decompression/ diskectomy/ fusion;  Surgeon: Hilda Lias, MD;  Location: [facility];  Service: Neurosurgery;  Laterality: N/A;  C4-5 C5-6 C6-7 Anterior cervical decompression/diskectomy/fusion  . Carpal tunnel release Right 02/24/2015    Procedure: RIGHT CARPAL TUNNEL RELEASE;  Surgeon: Hilda Lias, MD;  Location: [facility];  Service: Neurosurgery;  Laterality: Right;  Right Carpal tunnel release   Past Medical History  Diagnosis Date  . Pulmonary embolus   . Lyme disease   . Blood clot in vein   . RSD (reflex sympathetic dystrophy)   . Fibromyalgia   . PMR (polymyalgia rheumatica)   . Thyroid disease   . Hypertension   . Arthritis   . Asthma   . Headache     H/O migraines  . Colitis     appendecticitis   BP 139/87 mmHg  Pulse 75  Resp 14  SpO2 97%  Opioid Risk Score:   Fall Risk Score:  `1  Depression screen PHQ 2/9  Depression screen PHQ 2/9 06/18/2015  Decreased Interest 0  Down, Depressed, Hopeless 0  PHQ - 2  Score 0     Review of Systems  Neurological:       Spasms        Objective:   Physical Exam  Constitutional: She is oriented to person, place, and time. She appears well-developed and well-nourished.  HENT:  Head: Normocephalic and atraumatic.  Eyes: Conjunctivae and EOM are normal. Pupils are equal, round, and reactive to light.  Musculoskeletal:       Right hip: She exhibits bony tenderness.       Left hip: She exhibits bony tenderness.       Lumbar back: She exhibits decreased range of motion.  Neurological: She is alert and oriented to person, place, and time.  Psychiatric: She has a normal mood and affect.  Nursing note and vitals reviewed.  Motor strength is 0/5 in the right deltoid and right biceps 4 minus at the tricep 4 at finger flexors and  finger extensors.  Lower extremity strength is 5/5 bilateral hip flexor and extensor ankle dorsal flexor Left upper extremity 5/5 in the left deltoid, biceps, triceps, grip        Assessment & Plan:  1. Lumbar spondylosis with chronic low back pain she has had good response with medial branch blocks in the past as well as sacroiliac injections. Her pain is more in the lumbar area we'll repeat medial branch blocks next month it's been several months since the last injection. The injection helped for 3-4 months  2. Right C5 radiculopathy, severe with deltoid and biceps weakness. Patient will follow up with neurosurgery

## 2015-07-01 DIAGNOSIS — R1030 Lower abdominal pain, unspecified: Secondary | ICD-10-CM | POA: Diagnosis not present

## 2015-07-01 DIAGNOSIS — Z23 Encounter for immunization: Secondary | ICD-10-CM | POA: Diagnosis not present

## 2015-07-02 ENCOUNTER — Other Ambulatory Visit (HOSPITAL_COMMUNITY): Payer: Self-pay | Admitting: Family Medicine

## 2015-07-02 ENCOUNTER — Ambulatory Visit (HOSPITAL_COMMUNITY)
Admission: RE | Admit: 2015-07-02 | Discharge: 2015-07-02 | Disposition: A | Discharge status: Home / Self Care | Payer: Medicare Other | Source: Ambulatory Visit | Attending: Family Medicine | Admitting: Family Medicine

## 2015-07-02 DIAGNOSIS — R1030 Lower abdominal pain, unspecified: Secondary | ICD-10-CM | POA: Diagnosis not present

## 2015-07-02 DIAGNOSIS — R609 Edema, unspecified: Secondary | ICD-10-CM | POA: Diagnosis not present

## 2015-07-02 NOTE — Progress Notes (Signed)
VASCULAR LAB PRELIMINARY  PRELIMINARY  PRELIMINARY  PRELIMINARY  Right lower extremity venous duplex completed.    Preliminary report:  Right:  No evidence of DVT or Baker's cyst.  Thrombus noted in varicose veins in the suprapubic area.    Melitta Tigue, RVT 07/02/2015, 10:27 AM

## 2015-07-07 DIAGNOSIS — I809 Phlebitis and thrombophlebitis of unspecified site: Secondary | ICD-10-CM | POA: Diagnosis not present

## 2015-07-14 DIAGNOSIS — Z7901 Long term (current) use of anticoagulants: Secondary | ICD-10-CM | POA: Diagnosis not present

## 2015-07-19 ENCOUNTER — Ambulatory Visit: Payer: Medicare Other | Admitting: Physical Medicine & Rehabilitation

## 2015-07-29 DIAGNOSIS — Z7901 Long term (current) use of anticoagulants: Secondary | ICD-10-CM | POA: Diagnosis not present

## 2015-07-30 DIAGNOSIS — Z7901 Long term (current) use of anticoagulants: Secondary | ICD-10-CM | POA: Diagnosis not present

## 2015-08-04 DIAGNOSIS — Z7901 Long term (current) use of anticoagulants: Secondary | ICD-10-CM | POA: Diagnosis not present

## 2015-08-11 DIAGNOSIS — F419 Anxiety disorder, unspecified: Secondary | ICD-10-CM | POA: Diagnosis not present

## 2015-08-11 DIAGNOSIS — J40 Bronchitis, not specified as acute or chronic: Secondary | ICD-10-CM | POA: Diagnosis not present

## 2015-08-25 DIAGNOSIS — G8929 Other chronic pain: Secondary | ICD-10-CM | POA: Diagnosis not present

## 2015-08-25 DIAGNOSIS — R05 Cough: Secondary | ICD-10-CM | POA: Diagnosis not present

## 2015-09-15 DIAGNOSIS — G894 Chronic pain syndrome: Secondary | ICD-10-CM | POA: Diagnosis not present

## 2015-09-26 DIAGNOSIS — I8290 Acute embolism and thrombosis of unspecified vein: Secondary | ICD-10-CM

## 2015-09-26 HISTORY — DX: Acute embolism and thrombosis of unspecified vein: I82.90

## 2015-11-08 DIAGNOSIS — G5602 Carpal tunnel syndrome, left upper limb: Secondary | ICD-10-CM | POA: Diagnosis not present

## 2015-11-08 DIAGNOSIS — M19042 Primary osteoarthritis, left hand: Secondary | ICD-10-CM | POA: Diagnosis not present

## 2015-11-08 DIAGNOSIS — M1812 Unilateral primary osteoarthritis of first carpometacarpal joint, left hand: Secondary | ICD-10-CM | POA: Diagnosis not present

## 2015-11-10 DIAGNOSIS — M19042 Primary osteoarthritis, left hand: Secondary | ICD-10-CM | POA: Diagnosis not present

## 2015-11-10 DIAGNOSIS — G5602 Carpal tunnel syndrome, left upper limb: Secondary | ICD-10-CM | POA: Diagnosis not present

## 2015-11-10 DIAGNOSIS — F419 Anxiety disorder, unspecified: Secondary | ICD-10-CM | POA: Diagnosis not present

## 2015-11-10 DIAGNOSIS — E785 Hyperlipidemia, unspecified: Secondary | ICD-10-CM | POA: Diagnosis not present

## 2015-11-10 DIAGNOSIS — M1812 Unilateral primary osteoarthritis of first carpometacarpal joint, left hand: Secondary | ICD-10-CM | POA: Diagnosis not present

## 2015-11-10 DIAGNOSIS — F3342 Major depressive disorder, recurrent, in full remission: Secondary | ICD-10-CM | POA: Diagnosis not present

## 2015-11-10 DIAGNOSIS — I10 Essential (primary) hypertension: Secondary | ICD-10-CM | POA: Diagnosis not present

## 2015-12-13 DIAGNOSIS — M25512 Pain in left shoulder: Secondary | ICD-10-CM | POA: Diagnosis not present

## 2015-12-13 DIAGNOSIS — M1812 Unilateral primary osteoarthritis of first carpometacarpal joint, left hand: Secondary | ICD-10-CM | POA: Diagnosis not present

## 2015-12-28 DIAGNOSIS — E785 Hyperlipidemia, unspecified: Secondary | ICD-10-CM | POA: Diagnosis not present

## 2015-12-28 DIAGNOSIS — E039 Hypothyroidism, unspecified: Secondary | ICD-10-CM | POA: Diagnosis not present

## 2015-12-28 DIAGNOSIS — F3342 Major depressive disorder, recurrent, in full remission: Secondary | ICD-10-CM | POA: Diagnosis not present

## 2015-12-28 DIAGNOSIS — G8929 Other chronic pain: Secondary | ICD-10-CM | POA: Diagnosis not present

## 2015-12-28 DIAGNOSIS — I10 Essential (primary) hypertension: Secondary | ICD-10-CM | POA: Diagnosis not present

## 2015-12-29 ENCOUNTER — Encounter: Payer: Self-pay | Admitting: Physical Medicine & Rehabilitation

## 2016-01-10 DIAGNOSIS — M25512 Pain in left shoulder: Secondary | ICD-10-CM | POA: Diagnosis not present

## 2016-02-25 DIAGNOSIS — G8929 Other chronic pain: Secondary | ICD-10-CM | POA: Diagnosis not present

## 2016-02-25 DIAGNOSIS — K219 Gastro-esophageal reflux disease without esophagitis: Secondary | ICD-10-CM | POA: Diagnosis not present

## 2016-02-25 DIAGNOSIS — F3342 Major depressive disorder, recurrent, in full remission: Secondary | ICD-10-CM | POA: Diagnosis not present

## 2016-03-20 DIAGNOSIS — M2550 Pain in unspecified joint: Secondary | ICD-10-CM | POA: Diagnosis not present

## 2016-03-20 DIAGNOSIS — G8929 Other chronic pain: Secondary | ICD-10-CM | POA: Diagnosis not present

## 2016-03-20 DIAGNOSIS — R609 Edema, unspecified: Secondary | ICD-10-CM | POA: Diagnosis not present

## 2016-04-17 ENCOUNTER — Other Ambulatory Visit: Payer: Self-pay | Admitting: Family Medicine

## 2016-04-17 ENCOUNTER — Ambulatory Visit
Admission: RE | Admit: 2016-04-17 | Discharge: 2016-04-17 | Disposition: A | Discharge status: Home / Self Care | Payer: Medicare Other | Source: Ambulatory Visit | Attending: Family Medicine | Admitting: Family Medicine

## 2016-04-17 DIAGNOSIS — M79605 Pain in left leg: Secondary | ICD-10-CM

## 2016-04-17 DIAGNOSIS — I82412 Acute embolism and thrombosis of left femoral vein: Secondary | ICD-10-CM | POA: Diagnosis not present

## 2016-04-24 DIAGNOSIS — Z7901 Long term (current) use of anticoagulants: Secondary | ICD-10-CM | POA: Diagnosis not present

## 2016-05-01 DIAGNOSIS — Z7901 Long term (current) use of anticoagulants: Secondary | ICD-10-CM | POA: Diagnosis not present

## 2016-05-05 DIAGNOSIS — Z7901 Long term (current) use of anticoagulants: Secondary | ICD-10-CM | POA: Diagnosis not present

## 2016-05-17 DIAGNOSIS — R1310 Dysphagia, unspecified: Secondary | ICD-10-CM | POA: Diagnosis not present

## 2016-05-17 DIAGNOSIS — R5383 Other fatigue: Secondary | ICD-10-CM | POA: Diagnosis not present

## 2016-05-17 DIAGNOSIS — G8929 Other chronic pain: Secondary | ICD-10-CM | POA: Diagnosis not present

## 2016-05-17 DIAGNOSIS — Z7901 Long term (current) use of anticoagulants: Secondary | ICD-10-CM | POA: Diagnosis not present

## 2016-05-18 ENCOUNTER — Other Ambulatory Visit: Payer: Self-pay | Admitting: Family Medicine

## 2016-05-18 DIAGNOSIS — E041 Nontoxic single thyroid nodule: Secondary | ICD-10-CM

## 2016-05-19 ENCOUNTER — Ambulatory Visit
Admission: RE | Admit: 2016-05-19 | Discharge: 2016-05-19 | Disposition: A | Discharge status: Home / Self Care | Payer: Medicare Other | Source: Ambulatory Visit | Attending: Family Medicine | Admitting: Family Medicine

## 2016-05-19 DIAGNOSIS — E041 Nontoxic single thyroid nodule: Secondary | ICD-10-CM

## 2016-05-19 DIAGNOSIS — R0989 Other specified symptoms and signs involving the circulatory and respiratory systems: Secondary | ICD-10-CM | POA: Diagnosis not present

## 2016-05-24 DIAGNOSIS — Z7901 Long term (current) use of anticoagulants: Secondary | ICD-10-CM | POA: Diagnosis not present

## 2016-05-25 DIAGNOSIS — R1312 Dysphagia, oropharyngeal phase: Secondary | ICD-10-CM | POA: Diagnosis not present

## 2016-05-25 DIAGNOSIS — R29898 Other symptoms and signs involving the musculoskeletal system: Secondary | ICD-10-CM | POA: Diagnosis not present

## 2016-05-25 DIAGNOSIS — Z86718 Personal history of other venous thrombosis and embolism: Secondary | ICD-10-CM | POA: Diagnosis not present

## 2016-06-12 ENCOUNTER — Other Ambulatory Visit (HOSPITAL_COMMUNITY): Payer: Self-pay | Admitting: Gastroenterology

## 2016-06-12 DIAGNOSIS — R1310 Dysphagia, unspecified: Secondary | ICD-10-CM

## 2016-06-14 ENCOUNTER — Ambulatory Visit (HOSPITAL_COMMUNITY)
Admission: RE | Admit: 2016-06-14 | Discharge: 2016-06-14 | Disposition: A | Discharge status: Home / Self Care | Payer: Medicare Other | Source: Ambulatory Visit | Attending: Gastroenterology | Admitting: Gastroenterology

## 2016-06-14 DIAGNOSIS — R1312 Dysphagia, oropharyngeal phase: Secondary | ICD-10-CM | POA: Insufficient documentation

## 2016-06-14 DIAGNOSIS — R1310 Dysphagia, unspecified: Secondary | ICD-10-CM

## 2016-06-24 DIAGNOSIS — S52501A Unspecified fracture of the lower end of right radius, initial encounter for closed fracture: Secondary | ICD-10-CM | POA: Diagnosis not present

## 2016-06-26 DIAGNOSIS — S52501A Unspecified fracture of the lower end of right radius, initial encounter for closed fracture: Secondary | ICD-10-CM | POA: Diagnosis not present

## 2016-06-27 ENCOUNTER — Other Ambulatory Visit: Payer: Self-pay | Admitting: Family Medicine

## 2016-06-27 DIAGNOSIS — I82409 Acute embolism and thrombosis of unspecified deep veins of unspecified lower extremity: Secondary | ICD-10-CM | POA: Diagnosis not present

## 2016-06-27 DIAGNOSIS — G8929 Other chronic pain: Secondary | ICD-10-CM | POA: Diagnosis not present

## 2016-06-27 DIAGNOSIS — F458 Other somatoform disorders: Secondary | ICD-10-CM | POA: Diagnosis not present

## 2016-06-27 DIAGNOSIS — F331 Major depressive disorder, recurrent, moderate: Secondary | ICD-10-CM | POA: Diagnosis not present

## 2016-06-27 DIAGNOSIS — Z23 Encounter for immunization: Secondary | ICD-10-CM | POA: Diagnosis not present

## 2016-06-27 DIAGNOSIS — I82402 Acute embolism and thrombosis of unspecified deep veins of left lower extremity: Secondary | ICD-10-CM

## 2016-06-27 DIAGNOSIS — Z7901 Long term (current) use of anticoagulants: Secondary | ICD-10-CM | POA: Diagnosis not present

## 2016-07-10 ENCOUNTER — Ambulatory Visit
Admission: RE | Admit: 2016-07-10 | Discharge: 2016-07-10 | Disposition: A | Discharge status: Home / Self Care | Payer: Medicare Other | Source: Ambulatory Visit | Attending: Family Medicine | Admitting: Family Medicine

## 2016-07-10 DIAGNOSIS — I82402 Acute embolism and thrombosis of unspecified deep veins of left lower extremity: Secondary | ICD-10-CM | POA: Diagnosis not present

## 2016-07-12 DIAGNOSIS — S52501D Unspecified fracture of the lower end of right radius, subsequent encounter for closed fracture with routine healing: Secondary | ICD-10-CM | POA: Diagnosis not present

## 2016-07-12 DIAGNOSIS — M79642 Pain in left hand: Secondary | ICD-10-CM | POA: Diagnosis not present

## 2016-07-31 DIAGNOSIS — Z7901 Long term (current) use of anticoagulants: Secondary | ICD-10-CM | POA: Diagnosis not present

## 2016-08-09 DIAGNOSIS — M1812 Unilateral primary osteoarthritis of first carpometacarpal joint, left hand: Secondary | ICD-10-CM | POA: Diagnosis not present

## 2016-08-10 DIAGNOSIS — Z7901 Long term (current) use of anticoagulants: Secondary | ICD-10-CM | POA: Diagnosis not present

## 2016-08-23 DIAGNOSIS — Z7901 Long term (current) use of anticoagulants: Secondary | ICD-10-CM | POA: Diagnosis not present

## 2016-08-23 DIAGNOSIS — M25551 Pain in right hip: Secondary | ICD-10-CM | POA: Diagnosis not present

## 2016-08-30 DIAGNOSIS — Z7901 Long term (current) use of anticoagulants: Secondary | ICD-10-CM | POA: Diagnosis not present

## 2016-09-06 DIAGNOSIS — S52501D Unspecified fracture of the lower end of right radius, subsequent encounter for closed fracture with routine healing: Secondary | ICD-10-CM | POA: Diagnosis not present

## 2016-09-07 DIAGNOSIS — Z7901 Long term (current) use of anticoagulants: Secondary | ICD-10-CM | POA: Diagnosis not present

## 2016-10-03 DIAGNOSIS — Z7901 Long term (current) use of anticoagulants: Secondary | ICD-10-CM | POA: Diagnosis not present

## 2016-10-03 DIAGNOSIS — G8929 Other chronic pain: Secondary | ICD-10-CM | POA: Diagnosis not present

## 2016-10-03 DIAGNOSIS — R05 Cough: Secondary | ICD-10-CM | POA: Diagnosis not present

## 2016-10-19 DIAGNOSIS — Z7901 Long term (current) use of anticoagulants: Secondary | ICD-10-CM | POA: Diagnosis not present

## 2016-11-07 DIAGNOSIS — Z7901 Long term (current) use of anticoagulants: Secondary | ICD-10-CM | POA: Diagnosis not present

## 2016-11-23 DIAGNOSIS — Z7901 Long term (current) use of anticoagulants: Secondary | ICD-10-CM | POA: Diagnosis not present

## 2016-11-29 DIAGNOSIS — Z7901 Long term (current) use of anticoagulants: Secondary | ICD-10-CM | POA: Diagnosis not present

## 2016-12-07 DIAGNOSIS — Z7901 Long term (current) use of anticoagulants: Secondary | ICD-10-CM | POA: Diagnosis not present

## 2016-12-25 DIAGNOSIS — M79642 Pain in left hand: Secondary | ICD-10-CM | POA: Diagnosis not present

## 2016-12-26 DIAGNOSIS — Z7901 Long term (current) use of anticoagulants: Secondary | ICD-10-CM | POA: Diagnosis not present

## 2017-01-01 DIAGNOSIS — M79642 Pain in left hand: Secondary | ICD-10-CM | POA: Diagnosis not present

## 2017-01-30 DIAGNOSIS — E785 Hyperlipidemia, unspecified: Secondary | ICD-10-CM | POA: Diagnosis not present

## 2017-01-30 DIAGNOSIS — Z0000 Encounter for general adult medical examination without abnormal findings: Secondary | ICD-10-CM | POA: Diagnosis not present

## 2017-01-30 DIAGNOSIS — I10 Essential (primary) hypertension: Secondary | ICD-10-CM | POA: Diagnosis not present

## 2017-01-30 DIAGNOSIS — M810 Age-related osteoporosis without current pathological fracture: Secondary | ICD-10-CM | POA: Diagnosis not present

## 2017-01-30 DIAGNOSIS — G894 Chronic pain syndrome: Secondary | ICD-10-CM | POA: Diagnosis not present

## 2017-01-30 DIAGNOSIS — F331 Major depressive disorder, recurrent, moderate: Secondary | ICD-10-CM | POA: Diagnosis not present

## 2017-01-30 DIAGNOSIS — Z7901 Long term (current) use of anticoagulants: Secondary | ICD-10-CM | POA: Diagnosis not present

## 2017-01-30 DIAGNOSIS — E039 Hypothyroidism, unspecified: Secondary | ICD-10-CM | POA: Diagnosis not present

## 2017-03-02 DIAGNOSIS — E039 Hypothyroidism, unspecified: Secondary | ICD-10-CM | POA: Diagnosis not present

## 2017-03-02 DIAGNOSIS — Z7901 Long term (current) use of anticoagulants: Secondary | ICD-10-CM | POA: Diagnosis not present

## 2017-03-14 DIAGNOSIS — Z7901 Long term (current) use of anticoagulants: Secondary | ICD-10-CM | POA: Diagnosis not present

## 2017-03-30 DIAGNOSIS — M1812 Unilateral primary osteoarthritis of first carpometacarpal joint, left hand: Secondary | ICD-10-CM | POA: Diagnosis not present

## 2017-03-30 DIAGNOSIS — M79642 Pain in left hand: Secondary | ICD-10-CM | POA: Diagnosis not present

## 2017-04-17 DIAGNOSIS — Z7901 Long term (current) use of anticoagulants: Secondary | ICD-10-CM | POA: Diagnosis not present

## 2017-04-17 DIAGNOSIS — J209 Acute bronchitis, unspecified: Secondary | ICD-10-CM | POA: Diagnosis not present

## 2017-05-09 DIAGNOSIS — Z7901 Long term (current) use of anticoagulants: Secondary | ICD-10-CM | POA: Diagnosis not present

## 2017-05-09 DIAGNOSIS — F331 Major depressive disorder, recurrent, moderate: Secondary | ICD-10-CM | POA: Diagnosis not present

## 2017-05-11 DIAGNOSIS — Z7901 Long term (current) use of anticoagulants: Secondary | ICD-10-CM | POA: Diagnosis not present

## 2017-05-21 DIAGNOSIS — Z7901 Long term (current) use of anticoagulants: Secondary | ICD-10-CM | POA: Diagnosis not present

## 2017-05-30 DIAGNOSIS — Z7901 Long term (current) use of anticoagulants: Secondary | ICD-10-CM | POA: Diagnosis not present

## 2017-06-03 DIAGNOSIS — R112 Nausea with vomiting, unspecified: Secondary | ICD-10-CM | POA: Diagnosis not present

## 2017-06-03 DIAGNOSIS — N390 Urinary tract infection, site not specified: Secondary | ICD-10-CM | POA: Diagnosis not present

## 2017-06-03 DIAGNOSIS — N399 Disorder of urinary system, unspecified: Secondary | ICD-10-CM | POA: Diagnosis not present

## 2017-06-04 DIAGNOSIS — G894 Chronic pain syndrome: Secondary | ICD-10-CM | POA: Diagnosis not present

## 2017-06-04 DIAGNOSIS — Z7901 Long term (current) use of anticoagulants: Secondary | ICD-10-CM | POA: Diagnosis not present

## 2017-06-04 DIAGNOSIS — R399 Unspecified symptoms and signs involving the genitourinary system: Secondary | ICD-10-CM | POA: Diagnosis not present

## 2017-06-04 DIAGNOSIS — Z23 Encounter for immunization: Secondary | ICD-10-CM | POA: Diagnosis not present

## 2017-06-12 ENCOUNTER — Encounter: Payer: Self-pay | Admitting: Gastroenterology

## 2017-06-12 DIAGNOSIS — Z7901 Long term (current) use of anticoagulants: Secondary | ICD-10-CM | POA: Diagnosis not present

## 2017-07-05 ENCOUNTER — Encounter: Payer: Self-pay | Admitting: Gastroenterology

## 2017-07-05 ENCOUNTER — Encounter (INDEPENDENT_AMBULATORY_CARE_PROVIDER_SITE_OTHER): Payer: Self-pay

## 2017-07-05 ENCOUNTER — Ambulatory Visit (INDEPENDENT_AMBULATORY_CARE_PROVIDER_SITE_OTHER): Payer: Medicare Other | Admitting: Gastroenterology

## 2017-07-05 VITALS — BP 124/72 | HR 72 | Ht 65.0 in | Wt 144.8 lb

## 2017-07-05 DIAGNOSIS — R1310 Dysphagia, unspecified: Secondary | ICD-10-CM | POA: Diagnosis not present

## 2017-07-05 DIAGNOSIS — R1319 Other dysphagia: Secondary | ICD-10-CM

## 2017-07-05 DIAGNOSIS — Z7901 Long term (current) use of anticoagulants: Secondary | ICD-10-CM

## 2017-07-05 DIAGNOSIS — R1314 Dysphagia, pharyngoesophageal phase: Secondary | ICD-10-CM

## 2017-07-05 NOTE — Progress Notes (Signed)
[facility] Consult Note:  History: Elizabeth [name] 07/05/2017  Referring physician: Shirlean Mylar, MD  Reason for consult/chief complaint: Dysphagia (Family hx of dilation, food lodged in esophagus/ chest pain)   Subjective  HPI:  This is a 76 year old woman referred by primary care for dysphagia. I have a recent office note for review, and the patient directed me to a modified barium results she had a year ago. This report indicates that the study was ordered by Dr. Doretha Imus, but the patient says she has never been seen by another GI doctor in the past. Elizabeth [name] describes at least 2 years of altered voice with hoarseness and "throat wheezing" ever since an ACDF for multilevel fusion in 2016. She says after that the vocal change and dysphagia. Some food and pills will feel stuck in the neck. She can eat most everything she wants, but sometimes fish might feel stuck lower in the chest. She had a modified barium study a year ago and only recalls that she was told "that I should eat baby food". She also says primary care's aware of this vocal change, she has not previously been seen by ENT, and reports that if she takes a Valium, that symptoms will resolve within 5 minutes. She feels it is likely anxiety related. I'm not able to get much more of a history, other than for her to state that multiple family members have had their esophagus stretched. It seems that her appetite is reasonably good and her weight stable. She complains of multiple pain issues and weakness in her right arm ever since the cervical fusion.  ROS:  Review of Systems  Constitutional: Negative for appetite change and unexpected weight change.  HENT: Positive for trouble swallowing and voice change. Negative for mouth sores.   Eyes: Negative for pain and redness.  Respiratory: Positive for shortness of breath. Negative for cough.   Cardiovascular: Negative for chest pain and palpitations.    Genitourinary: Negative for dysuria and hematuria.  Musculoskeletal: Positive for arthralgias, myalgias, neck pain and neck stiffness.  Skin: Negative for pallor and rash.  Neurological: Negative for weakness and headaches.  Hematological: Negative for adenopathy.     Past Medical History: Past Medical History:  Diagnosis Date  . Anxiety   . Arthritis   . Asthma   . Blood clot in vein   . Colitis    appendecticitis  . Depression   . Fibromyalgia   . GERD (gastroesophageal reflux disease)   . Headache    H/O migraines  . Hyperlipidemia   . Hypertension   . Hypothyroidism   . Kidney disease, chronic, stage III (GFR 30-59 ml/min) (HCC)   . Lyme disease   . Osteoporosis   . PMR (polymyalgia rheumatica) (HCC)   . Polymyalgia rheumatica (HCC)   . Pulmonary embolus (HCC)   . RSD (reflex sympathetic dystrophy)   . Thyroid disease      Past Surgical History: Past Surgical History:  Procedure Laterality Date  . ANTERIOR CERVICAL DECOMP/DISCECTOMY FUSION N/A 02/24/2015   Procedure: Cervical four-five cervical five-six cervical six- seven anterior cervical decompression/ diskectomy/ fusion;  Surgeon: Hilda Lias, MD;  Location: [facility];  Service: Neurosurgery;  Laterality: N/A;  C4-5 C5-6 C6-7 Anterior cervical decompression/diskectomy/fusion  . APPENDECTOMY    . BACK SURGERY    . CARPAL TUNNEL RELEASE Right 02/24/2015   Procedure: RIGHT CARPAL TUNNEL RELEASE;  Surgeon: Hilda Lias, MD;  Location: [facility];  Service: Neurosurgery;  Laterality: Right;  Right Carpal  tunnel release  . CHOLECYSTECTOMY    . DILATION AND CURETTAGE OF UTERUS    . EYE SURGERY     Lasik  . ivc filter    . THROMBECTOMY / EMBOLECTOMY VENA CAVA       Family History: Family History  Problem Relation Age of Onset  . Brain cancer Daughter     Social History: Social History   Social History  . Marital status: Widowed    Spouse name: N/A  . Number of children: 2  . Years of  education: N/A   Social History Main Topics  . Smoking status: Never Smoker  . Smokeless tobacco: Never Used  . Alcohol use 0.0 oz/week     Comment: rare glass of wine  . Drug use: No  . Sexual activity: No   Other Topics Concern  . None   Social History Narrative  . None    Allergies: Allergies  Allergen Reactions  . Ioxaglate Anaphylaxis  . Ivp Dye [Iodinated Diagnostic Agents] Anaphylaxis  . Dilaudid [Hydromorphone Hcl] Other (See Comments)    Pt. States that it makes her crazy  . Hydromorphone Other (See Comments)    Pt. States that it makes her crazy  . Gabapentin     confusion  . Codeine Palpitations    Outpatient Meds: Current Outpatient Prescriptions  Medication Sig Dispense Refill  . citalopram (CELEXA) 10 MG tablet Take 30 mg by mouth daily.     . cyclobenzaprine (FLEXERIL) 10 MG tablet Take 10 mg by mouth 3 (three) times daily as needed.  5  . diazepam (VALIUM) 5 MG tablet Take 5 mg by mouth every 6 (six) hours as needed for anxiety.    Marland Kitchen levothyroxine (SYNTHROID, LEVOTHROID) 75 MCG tablet Take 100 mcg by mouth daily before breakfast.     . lidocaine (LIDODERM) 5 % Place 1 patch onto the skin daily. Remove & Discard patch within 12 hours or as directed by MD    . oxyCODONE-acetaminophen (PERCOCET) 10-325 MG tablet Take 1 tablet by mouth every 4 (four) hours as needed.  0  . verapamil (CALAN) 80 MG tablet Take 80 mg by mouth 2 (two) times daily.    Marland Kitchen warfarin (COUMADIN) 2.5 MG tablet Take 2.5 mg by mouth daily.     No current facility-administered medications for this visit.       ___________________________________________________________________ Objective   Exam:  BP 124/72   Pulse 72   Ht  (1.651 m)   Wt 144 lb 12.8 oz (65.7 kg)   BMI 24.10 kg/m    General: this is a(n) Pleasant woman with altered vocal quality, sometimes cracking or "wheezing"   Eyes: sclera anicteric, no redness  ENT: oral mucosa moist without lesions, no cervical  or supraclavicular lymphadenopathy, good dentition  CV: RRR without murmur, S1/S2, no JVD, no peripheral edema  Resp: clear to auscultation bilaterally, normal RR and effort noted  GI: soft, no tenderness, with active bowel sounds. No guarding or palpable organomegaly noted.  Skin; warm and dry, no rash or jaundice noted  Neuro: She has weakness of the entire right arm with loss of muscle mass. Limited range of motion of the neck  Labs:  Modified barium study from 06/14/2016 was personally reviewed. Liquid bolus passes without difficulty and there is no aspiration. She appears to have mild impingement on the cervical esophagus by the surgical hardware.  Assessment: Encounter Diagnoses  Name Primary?  Marland Kitchen Dysphagia, pharyngoesophageal phase Yes  . Esophageal dysphagia   .  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 She has a separate symptom of some esophageal dysphagia, but only when she eats fish. I found that somewhat difficult to characterize. In sum, it is not clear to me if she has a stricture that is amenable to endoscopic dilation. 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 An upper endoscopy could be performed if absolutely necessary, but I'm not yet certain that is the case.   Plan:  Barium swallow with tablet to see if there is any lesion in the body of the thoracic esophagus might be amenable to dilation. She understood my reasoning and is in complete agreement with this plan.  Thank you for the courtesy of this consult.  Please call me with any questions or concerns.  Charlie Pitter III  CC: Shirlean Mylar, MD

## 2017-07-05 NOTE — Patient Instructions (Signed)
If you are age 75 or older, your body mass index should be between 23-30. Your Body mass index is 24.1 kg/m. If this is out of the aforementioned range listed, please consider follow up with your Primary Care Provider.  If you are age 20 or younger, your body mass index should be between 19-25. Your Body mass index is 24.1 kg/m. If this is out of the aformentioned range listed, please consider follow up with your Primary Care Provider.   You have been scheduled for a Barium Esophogram at [facility] (1st floor of the hospital) on 07-12-2017 at 1030am. Please arrive 15 minutes prior to your appointment for registration. Make certain not to have anything to eat or drink 3 hours prior to your test. If you need to reschedule for any reason, please contact radiology at [phone] to do so. __________________________________________________________________ A barium swallow is an examination that concentrates on views of the esophagus. This tends to be a double contrast exam (barium and two liquids which, when combined, create a gas to distend the wall of the oesophagus) or single contrast (non-ionic iodine based). The study is usually tailored to your symptoms so a good history is essential. Attention is paid during the study to the form, structure and configuration of the esophagus, looking for functional disorders (such as aspiration, dysphagia, achalasia, motility and reflux) EXAMINATION You may be asked to change into a gown, depending on the type of swallow being performed. A radiologist and radiographer will perform the procedure. The radiologist will advise you of the type of contrast selected for your procedure and direct you during the exam. You will be asked to stand, sit or lie in several different positions and to hold a small amount of fluid in your mouth before being asked to swallow while the imaging is performed .In some instances you may be asked to swallow barium coated  marshmallows to assess the motility of a solid food bolus. The exam can be recorded as a digital or video fluoroscopy procedure. POST PROCEDURE It will take 1-2 days for the barium to pass through your system. To facilitate this, it is important, unless otherwise directed, to increase your fluids for the next 24-48hrs and to resume your normal diet.  This test typically takes about 30 minutes to perform. __________________________________________________________________________________ Thank you for choosing [facility]  Dr Amada Jupiter III

## 2017-07-12 ENCOUNTER — Ambulatory Visit (HOSPITAL_COMMUNITY)
Admission: RE | Admit: 2017-07-12 | Discharge: 2017-07-12 | Disposition: A | Discharge status: Home / Self Care | Payer: Medicare Other | Source: Ambulatory Visit | Attending: Gastroenterology | Admitting: Gastroenterology

## 2017-07-12 DIAGNOSIS — R1314 Dysphagia, pharyngoesophageal phase: Secondary | ICD-10-CM | POA: Diagnosis not present

## 2017-07-12 DIAGNOSIS — Z7901 Long term (current) use of anticoagulants: Secondary | ICD-10-CM | POA: Insufficient documentation

## 2017-07-12 DIAGNOSIS — R1319 Other dysphagia: Secondary | ICD-10-CM

## 2017-07-12 DIAGNOSIS — R1310 Dysphagia, unspecified: Secondary | ICD-10-CM

## 2017-07-18 ENCOUNTER — Other Ambulatory Visit: Payer: Self-pay | Admitting: Family Medicine

## 2017-07-18 ENCOUNTER — Ambulatory Visit
Admission: RE | Admit: 2017-07-18 | Discharge: 2017-07-18 | Disposition: A | Discharge status: Home / Self Care | Payer: Medicare Other | Source: Ambulatory Visit | Attending: Family Medicine | Admitting: Family Medicine

## 2017-07-18 DIAGNOSIS — R062 Wheezing: Secondary | ICD-10-CM

## 2017-07-18 DIAGNOSIS — R05 Cough: Secondary | ICD-10-CM | POA: Diagnosis not present

## 2017-07-23 DIAGNOSIS — F331 Major depressive disorder, recurrent, moderate: Secondary | ICD-10-CM | POA: Diagnosis not present

## 2017-07-23 DIAGNOSIS — Z7901 Long term (current) use of anticoagulants: Secondary | ICD-10-CM | POA: Diagnosis not present

## 2017-07-23 DIAGNOSIS — J209 Acute bronchitis, unspecified: Secondary | ICD-10-CM | POA: Diagnosis not present

## 2017-07-27 ENCOUNTER — Telehealth: Payer: Self-pay | Admitting: Gastroenterology

## 2017-07-27 ENCOUNTER — Other Ambulatory Visit: Payer: Self-pay

## 2017-07-27 DIAGNOSIS — K222 Esophageal obstruction: Secondary | ICD-10-CM

## 2017-07-27 NOTE — Telephone Encounter (Signed)
Patient is now ready to schedule her ED[facility] with [facility]. Scheduled for 08/27/17. She is on coumadin, I do not see who manages this medication, please advise on how long you would like her to hold the coumadin. Thanks.

## 2017-07-30 DIAGNOSIS — Z7901 Long term (current) use of anticoagulants: Secondary | ICD-10-CM | POA: Diagnosis not present

## 2017-07-31 ENCOUNTER — Telehealth: Payer: Self-pay

## 2017-07-31 ENCOUNTER — Other Ambulatory Visit: Payer: Self-pay

## 2017-07-31 NOTE — Telephone Encounter (Signed)
She would need to hold coumadin 4 days prior. I believe her PCP manages the coumadin, but you would need to check with the patient to be sure. If we can get an answer from PCP in next couple of days for clearance to hold that and if bridging lovenox needed, then I believe there is a spot at 10:15 on 11/13 b/c someone canceled.   If that doesn't work, then next available outpatient hospital endoscopy date. If bridging lovenox needed, we need  PCP to prescribe and give patient instructions for that.  CPT    [id number]

## 2017-07-31 NOTE — Telephone Encounter (Signed)
Patient advised that her procedure is changed to 09/03/17 at 0830, [facility]. Awaiting coumadin instructions from Dr. Hyman HopesWebb.

## 2017-07-31 NOTE — Telephone Encounter (Signed)
When I last spoke to patient she did not know who was managing her coumadin. Her PCP is Dr. Hyman HopesWebb. I have sent a anticoagulation letter to Dr. Hyman HopesWebb. Patient has pre-visit on 08/06/17.

## 2017-07-31 NOTE — Telephone Encounter (Signed)
Dr Myrtie Neitheranis,  Does pt need to stop/continue Coumadin for [facility]? Thank you, Aliviana Burdell/PV

## 2017-07-31 NOTE — Telephone Encounter (Signed)
Please see my message from earlier today to my MA Elon Spanneroni Todd regarding this. Yes, she needs to stop 4 days prior to EGD, and we need to hear from the patient's PCP regarding this.

## 2017-08-03 NOTE — Telephone Encounter (Signed)
Saunders GlanceHenry, FYI, I believe this your patient

## 2017-08-03 NOTE — Telephone Encounter (Signed)
Looks like this issue is covered.

## 2017-08-03 NOTE — Telephone Encounter (Signed)
E. McKew spoke with Dr. Marland McalpineWebb's office and ok received to hold Coumadin 4 days prior to procedure; no bridge is needed

## 2017-08-03 NOTE — Telephone Encounter (Signed)
Received a faxed response from Dr Shirlean Mylararol [facility] of [facility] [place] [phone] fax [phone]. Dr Hyman HopesWebb has contacted the patient and advised her she will be holding the Coumadin for 4 days prior to her procedure and no bridging will be needed.

## 2017-08-06 ENCOUNTER — Ambulatory Visit (AMBULATORY_SURGERY_CENTER): Payer: Self-pay | Admitting: *Deleted

## 2017-08-06 ENCOUNTER — Other Ambulatory Visit: Payer: Self-pay

## 2017-08-06 VITALS — Ht 65.0 in | Wt 143.4 lb

## 2017-08-06 DIAGNOSIS — Z7901 Long term (current) use of anticoagulants: Secondary | ICD-10-CM | POA: Diagnosis not present

## 2017-08-06 DIAGNOSIS — K222 Esophageal obstruction: Secondary | ICD-10-CM

## 2017-08-06 NOTE — Progress Notes (Signed)
No egg or soy allergy known to patient  No issues with past sedation with any surgeries  or procedures, no intubation problems  No diet pills per patient No home 02 use per patient  blood thinners per patient Warfarin Pt denies issues with constipation  No A fib or A flutter  EMMI video sent to pt's e mail

## 2017-08-13 DIAGNOSIS — Z7901 Long term (current) use of anticoagulants: Secondary | ICD-10-CM | POA: Diagnosis not present

## 2017-08-20 DIAGNOSIS — Z7901 Long term (current) use of anticoagulants: Secondary | ICD-10-CM | POA: Diagnosis not present

## 2017-08-29 ENCOUNTER — Other Ambulatory Visit: Payer: Self-pay

## 2017-08-29 ENCOUNTER — Encounter (HOSPITAL_COMMUNITY): Payer: Self-pay | Admitting: *Deleted

## 2017-08-31 ENCOUNTER — Other Ambulatory Visit: Payer: Self-pay

## 2017-09-02 ENCOUNTER — Encounter (HOSPITAL_COMMUNITY): Payer: Self-pay | Admitting: Anesthesiology

## 2017-09-02 NOTE — Anesthesia Preprocedure Evaluation (Deleted)
Anesthesia Evaluation  Patient identified by MRN, date of birth, ID band Patient awake    Reviewed: Allergy & Precautions, NPO status , Patient's Chart, lab work & pertinent test results  Airway Mallampati: II  TM Distance: >3 FB Neck ROM: Full    Dental no notable dental hx.    Pulmonary asthma , PE   Pulmonary exam normal breath sounds clear to auscultation       Cardiovascular hypertension, Pt. on medications + Peripheral Vascular Disease  Normal cardiovascular exam Rhythm:Regular Rate:Normal     Neuro/Psych  Headaches, negative psych ROS   GI/Hepatic negative GI ROS, Neg liver ROS, GERD  ,  Endo/Other  negative endocrine ROSHypothyroidism   Renal/GU CRFRenal diseasenegative Renal ROS     Musculoskeletal  (+) Arthritis , Fibromyalgia -, narcotic dependent  Abdominal   Peds  Hematology negative hematology ROS (+)   Anesthesia Other Findings   Reproductive/Obstetrics negative OB ROS                             Anesthesia Physical  Anesthesia Plan  ASA: III  Anesthesia Plan: MAC   Post-op Pain Management:    Induction:   PONV Risk Score and Plan: 2 and Ondansetron and Treatment may vary due to age or medical condition  Airway Management Planned: Nasal Cannula, Natural Airway and Mask  Additional Equipment:   Intra-op Plan:   Post-operative Plan: Extubation in OR  Informed Consent: I have reviewed the patients History and Physical, chart, labs and discussed the procedure including the risks, benefits and alternatives for the proposed anesthesia with the patient or authorized representative who has indicated his/her understanding and acceptance.   Dental advisory given  Plan Discussed with: CRNA and Anesthesiologist  Anesthesia Plan Comments: (  )       Anesthesia Quick Evaluation

## 2017-09-03 ENCOUNTER — Ambulatory Visit (HOSPITAL_COMMUNITY): Admission: RE | Admit: 2017-09-03 | Payer: Medicare Other | Source: Ambulatory Visit | Admitting: Gastroenterology

## 2017-09-03 HISTORY — DX: Phlebitis and thrombophlebitis of unspecified site: I80.9

## 2017-09-03 SURGERY — EGD (ESOPHAGOGASTRODUODENOSCOPY)
Anesthesia: Monitor Anesthesia Care

## 2017-09-05 ENCOUNTER — Telehealth: Payer: Self-pay

## 2017-09-05 NOTE — Telephone Encounter (Signed)
Left message for patient that her procedure has been rescheduled to [facility] on 09/28/17 at 8:00 am. Let her know that I have mailed her a new set of prep instructions that include the new date to stop her coumadin. She needs to be off of it 4 days prior.

## 2017-09-10 ENCOUNTER — Telehealth: Payer: Self-pay | Admitting: Gastroenterology

## 2017-09-10 NOTE — Telephone Encounter (Signed)
Noted  

## 2017-09-10 NOTE — Progress Notes (Signed)
Patient called Endo wanting to cancel her procedure in January. She said she will call back when she feels "up to it", but did not want to reschedule it now. Dr. Myrtie Neitheranis made aware.

## 2017-09-10 NOTE — Telephone Encounter (Signed)
-----   Message from Webb SilversmithElizabeth C Honeycutt, RN sent at 09/10/2017 12:00 PM EST ----- Patient called Endo wanting to cancel her procedure in January. She said she will call back when she feels "up to it", but did not want to reschedule it now! Just wanted to let you know! I'll put a progress note in her chart as well.

## 2017-09-10 NOTE — Telephone Encounter (Signed)
Thanks for letting us know. I will Koreacopy this to my nurse Raynelle FanningJulie so she is aware, and can keep the spot for another patient.

## 2017-09-19 DIAGNOSIS — Z7901 Long term (current) use of anticoagulants: Secondary | ICD-10-CM | POA: Diagnosis not present

## 2017-09-21 DIAGNOSIS — W01198A Fall on same level from slipping, tripping and stumbling with subsequent striking against other object, initial encounter: Secondary | ICD-10-CM | POA: Diagnosis not present

## 2017-09-21 DIAGNOSIS — Z7901 Long term (current) use of anticoagulants: Secondary | ICD-10-CM | POA: Diagnosis not present

## 2017-09-21 DIAGNOSIS — M25551 Pain in right hip: Secondary | ICD-10-CM | POA: Diagnosis not present

## 2017-09-21 DIAGNOSIS — M799 Soft tissue disorder, unspecified: Secondary | ICD-10-CM | POA: Diagnosis not present

## 2017-09-21 DIAGNOSIS — S8001XA Contusion of right knee, initial encounter: Secondary | ICD-10-CM | POA: Diagnosis not present

## 2017-09-28 ENCOUNTER — Encounter (HOSPITAL_COMMUNITY): Payer: Self-pay

## 2017-09-28 ENCOUNTER — Ambulatory Visit (HOSPITAL_COMMUNITY): Admit: 2017-09-28 | Payer: Medicare Other | Admitting: Gastroenterology

## 2017-09-28 SURGERY — ESOPHAGOGASTRODUODENOSCOPY (EGD) WITH PROPOFOL
Anesthesia: Monitor Anesthesia Care

## 2017-10-02 DIAGNOSIS — L03119 Cellulitis of unspecified part of limb: Secondary | ICD-10-CM | POA: Diagnosis not present

## 2017-10-02 DIAGNOSIS — Z7901 Long term (current) use of anticoagulants: Secondary | ICD-10-CM | POA: Diagnosis not present

## 2017-10-30 DIAGNOSIS — Z7901 Long term (current) use of anticoagulants: Secondary | ICD-10-CM | POA: Diagnosis not present

## 2017-10-31 ENCOUNTER — Other Ambulatory Visit: Payer: Self-pay | Admitting: Family Medicine

## 2017-10-31 DIAGNOSIS — I82A13 Acute embolism and thrombosis of axillary vein, bilateral: Secondary | ICD-10-CM

## 2017-11-06 ENCOUNTER — Ambulatory Visit
Admission: RE | Admit: 2017-11-06 | Discharge: 2017-11-06 | Disposition: A | Discharge status: Home / Self Care | Payer: Medicare Other | Source: Ambulatory Visit | Attending: Family Medicine | Admitting: Family Medicine

## 2017-11-06 DIAGNOSIS — I82A13 Acute embolism and thrombosis of axillary vein, bilateral: Secondary | ICD-10-CM

## 2017-11-07 DIAGNOSIS — G894 Chronic pain syndrome: Secondary | ICD-10-CM | POA: Diagnosis not present

## 2017-11-07 DIAGNOSIS — F331 Major depressive disorder, recurrent, moderate: Secondary | ICD-10-CM | POA: Diagnosis not present

## 2017-11-28 DIAGNOSIS — M79642 Pain in left hand: Secondary | ICD-10-CM | POA: Diagnosis not present

## 2017-12-31 DIAGNOSIS — R300 Dysuria: Secondary | ICD-10-CM | POA: Diagnosis not present

## 2017-12-31 DIAGNOSIS — I10 Essential (primary) hypertension: Secondary | ICD-10-CM | POA: Diagnosis not present

## 2017-12-31 DIAGNOSIS — E039 Hypothyroidism, unspecified: Secondary | ICD-10-CM | POA: Diagnosis not present

## 2017-12-31 DIAGNOSIS — E785 Hyperlipidemia, unspecified: Secondary | ICD-10-CM | POA: Diagnosis not present

## 2018-01-02 DIAGNOSIS — G8929 Other chronic pain: Secondary | ICD-10-CM | POA: Diagnosis not present

## 2018-02-13 DIAGNOSIS — E039 Hypothyroidism, unspecified: Secondary | ICD-10-CM | POA: Diagnosis not present

## 2018-03-06 DIAGNOSIS — M25561 Pain in right knee: Secondary | ICD-10-CM | POA: Diagnosis not present

## 2018-04-10 DIAGNOSIS — F330 Major depressive disorder, recurrent, mild: Secondary | ICD-10-CM | POA: Diagnosis not present

## 2018-04-10 DIAGNOSIS — M353 Polymyalgia rheumatica: Secondary | ICD-10-CM | POA: Diagnosis not present

## 2018-04-10 DIAGNOSIS — I10 Essential (primary) hypertension: Secondary | ICD-10-CM | POA: Diagnosis not present

## 2018-04-10 DIAGNOSIS — E039 Hypothyroidism, unspecified: Secondary | ICD-10-CM | POA: Diagnosis not present

## 2018-04-11 DIAGNOSIS — R300 Dysuria: Secondary | ICD-10-CM | POA: Diagnosis not present

## 2018-05-07 DIAGNOSIS — M5137 Other intervertebral disc degeneration, lumbosacral region: Secondary | ICD-10-CM | POA: Diagnosis not present

## 2018-05-07 DIAGNOSIS — M47817 Spondylosis without myelopathy or radiculopathy, lumbosacral region: Secondary | ICD-10-CM | POA: Diagnosis not present

## 2018-05-07 DIAGNOSIS — M461 Sacroiliitis, not elsewhere classified: Secondary | ICD-10-CM | POA: Diagnosis not present

## 2018-05-07 DIAGNOSIS — Z79891 Long term (current) use of opiate analgesic: Secondary | ICD-10-CM | POA: Diagnosis not present

## 2018-05-07 DIAGNOSIS — M5413 Radiculopathy, cervicothoracic region: Secondary | ICD-10-CM | POA: Diagnosis not present

## 2018-06-04 DIAGNOSIS — M47817 Spondylosis without myelopathy or radiculopathy, lumbosacral region: Secondary | ICD-10-CM | POA: Diagnosis not present

## 2018-06-04 DIAGNOSIS — M5137 Other intervertebral disc degeneration, lumbosacral region: Secondary | ICD-10-CM | POA: Diagnosis not present

## 2018-06-04 DIAGNOSIS — M5481 Occipital neuralgia: Secondary | ICD-10-CM | POA: Diagnosis not present

## 2018-06-04 DIAGNOSIS — G580 Intercostal neuropathy: Secondary | ICD-10-CM | POA: Diagnosis not present

## 2018-06-04 DIAGNOSIS — M461 Sacroiliitis, not elsewhere classified: Secondary | ICD-10-CM | POA: Diagnosis not present

## 2018-07-02 DIAGNOSIS — M5033 Other cervical disc degeneration, cervicothoracic region: Secondary | ICD-10-CM | POA: Diagnosis not present

## 2018-07-02 DIAGNOSIS — G580 Intercostal neuropathy: Secondary | ICD-10-CM | POA: Diagnosis not present

## 2018-07-02 DIAGNOSIS — M47817 Spondylosis without myelopathy or radiculopathy, lumbosacral region: Secondary | ICD-10-CM | POA: Diagnosis not present

## 2018-07-02 DIAGNOSIS — M5413 Radiculopathy, cervicothoracic region: Secondary | ICD-10-CM | POA: Diagnosis not present

## 2018-07-10 DIAGNOSIS — Z23 Encounter for immunization: Secondary | ICD-10-CM | POA: Diagnosis not present

## 2018-07-10 DIAGNOSIS — Z0001 Encounter for general adult medical examination with abnormal findings: Secondary | ICD-10-CM | POA: Diagnosis not present

## 2018-07-10 DIAGNOSIS — R4702 Dysphasia: Secondary | ICD-10-CM | POA: Diagnosis not present

## 2018-07-10 DIAGNOSIS — G894 Chronic pain syndrome: Secondary | ICD-10-CM | POA: Diagnosis not present

## 2018-07-10 DIAGNOSIS — I10 Essential (primary) hypertension: Secondary | ICD-10-CM | POA: Diagnosis not present

## 2018-07-10 DIAGNOSIS — R0602 Shortness of breath: Secondary | ICD-10-CM | POA: Diagnosis not present

## 2018-07-10 DIAGNOSIS — F330 Major depressive disorder, recurrent, mild: Secondary | ICD-10-CM | POA: Diagnosis not present

## 2018-07-10 DIAGNOSIS — E039 Hypothyroidism, unspecified: Secondary | ICD-10-CM | POA: Diagnosis not present

## 2018-07-10 DIAGNOSIS — M353 Polymyalgia rheumatica: Secondary | ICD-10-CM | POA: Diagnosis not present

## 2018-07-11 DIAGNOSIS — R0602 Shortness of breath: Secondary | ICD-10-CM | POA: Diagnosis not present

## 2018-08-06 DIAGNOSIS — R0600 Dyspnea, unspecified: Secondary | ICD-10-CM | POA: Diagnosis not present

## 2018-08-06 DIAGNOSIS — I071 Rheumatic tricuspid insufficiency: Secondary | ICD-10-CM | POA: Diagnosis not present

## 2018-08-13 DIAGNOSIS — M5413 Radiculopathy, cervicothoracic region: Secondary | ICD-10-CM | POA: Diagnosis not present

## 2018-08-13 DIAGNOSIS — M5033 Other cervical disc degeneration, cervicothoracic region: Secondary | ICD-10-CM | POA: Diagnosis not present

## 2018-08-13 DIAGNOSIS — M791 Myalgia, unspecified site: Secondary | ICD-10-CM | POA: Diagnosis not present

## 2018-08-13 DIAGNOSIS — M797 Fibromyalgia: Secondary | ICD-10-CM | POA: Diagnosis not present

## 2018-08-13 DIAGNOSIS — G580 Intercostal neuropathy: Secondary | ICD-10-CM | POA: Diagnosis not present

## 2018-08-14 DIAGNOSIS — I340 Nonrheumatic mitral (valve) insufficiency: Secondary | ICD-10-CM | POA: Diagnosis not present

## 2018-08-14 DIAGNOSIS — K219 Gastro-esophageal reflux disease without esophagitis: Secondary | ICD-10-CM | POA: Diagnosis not present

## 2018-08-14 DIAGNOSIS — J449 Chronic obstructive pulmonary disease, unspecified: Secondary | ICD-10-CM | POA: Diagnosis not present

## 2018-08-14 DIAGNOSIS — E039 Hypothyroidism, unspecified: Secondary | ICD-10-CM | POA: Diagnosis not present

## 2018-08-14 DIAGNOSIS — F330 Major depressive disorder, recurrent, mild: Secondary | ICD-10-CM | POA: Diagnosis not present

## 2018-09-11 DIAGNOSIS — I340 Nonrheumatic mitral (valve) insufficiency: Secondary | ICD-10-CM | POA: Diagnosis not present

## 2018-09-11 DIAGNOSIS — E039 Hypothyroidism, unspecified: Secondary | ICD-10-CM | POA: Diagnosis not present

## 2018-09-19 DIAGNOSIS — M5413 Radiculopathy, cervicothoracic region: Secondary | ICD-10-CM | POA: Diagnosis not present

## 2018-09-19 DIAGNOSIS — M5033 Other cervical disc degeneration, cervicothoracic region: Secondary | ICD-10-CM | POA: Diagnosis not present

## 2018-09-19 DIAGNOSIS — G580 Intercostal neuropathy: Secondary | ICD-10-CM | POA: Diagnosis not present

## 2018-09-19 DIAGNOSIS — M47817 Spondylosis without myelopathy or radiculopathy, lumbosacral region: Secondary | ICD-10-CM | POA: Diagnosis not present

## 2018-10-22 DIAGNOSIS — M797 Fibromyalgia: Secondary | ICD-10-CM | POA: Diagnosis not present

## 2018-10-22 DIAGNOSIS — M5413 Radiculopathy, cervicothoracic region: Secondary | ICD-10-CM | POA: Diagnosis not present

## 2018-10-22 DIAGNOSIS — Z79891 Long term (current) use of opiate analgesic: Secondary | ICD-10-CM | POA: Diagnosis not present

## 2018-10-22 DIAGNOSIS — M5033 Other cervical disc degeneration, cervicothoracic region: Secondary | ICD-10-CM | POA: Diagnosis not present

## 2018-10-22 DIAGNOSIS — G580 Intercostal neuropathy: Secondary | ICD-10-CM | POA: Diagnosis not present

## 2018-10-31 DIAGNOSIS — J040 Acute laryngitis: Secondary | ICD-10-CM | POA: Diagnosis not present

## 2018-10-31 DIAGNOSIS — R05 Cough: Secondary | ICD-10-CM | POA: Diagnosis not present

## 2018-11-08 DIAGNOSIS — E039 Hypothyroidism, unspecified: Secondary | ICD-10-CM | POA: Diagnosis not present

## 2018-11-13 DIAGNOSIS — F330 Major depressive disorder, recurrent, mild: Secondary | ICD-10-CM | POA: Diagnosis not present

## 2018-11-13 DIAGNOSIS — J449 Chronic obstructive pulmonary disease, unspecified: Secondary | ICD-10-CM | POA: Diagnosis not present

## 2018-11-13 DIAGNOSIS — E039 Hypothyroidism, unspecified: Secondary | ICD-10-CM | POA: Diagnosis not present

## 2018-11-13 DIAGNOSIS — I10 Essential (primary) hypertension: Secondary | ICD-10-CM | POA: Diagnosis not present

## 2018-11-13 DIAGNOSIS — K219 Gastro-esophageal reflux disease without esophagitis: Secondary | ICD-10-CM | POA: Diagnosis not present

## 2018-11-13 DIAGNOSIS — I340 Nonrheumatic mitral (valve) insufficiency: Secondary | ICD-10-CM | POA: Diagnosis not present

## 2018-11-19 DIAGNOSIS — G580 Intercostal neuropathy: Secondary | ICD-10-CM | POA: Diagnosis not present

## 2018-11-19 DIAGNOSIS — M47817 Spondylosis without myelopathy or radiculopathy, lumbosacral region: Secondary | ICD-10-CM | POA: Diagnosis not present

## 2018-11-19 DIAGNOSIS — M5033 Other cervical disc degeneration, cervicothoracic region: Secondary | ICD-10-CM | POA: Diagnosis not present

## 2018-11-19 DIAGNOSIS — M5137 Other intervertebral disc degeneration, lumbosacral region: Secondary | ICD-10-CM | POA: Diagnosis not present

## 2018-11-19 DIAGNOSIS — M47812 Spondylosis without myelopathy or radiculopathy, cervical region: Secondary | ICD-10-CM | POA: Diagnosis not present

## 2018-12-17 DIAGNOSIS — G580 Intercostal neuropathy: Secondary | ICD-10-CM | POA: Diagnosis not present

## 2018-12-17 DIAGNOSIS — M5137 Other intervertebral disc degeneration, lumbosacral region: Secondary | ICD-10-CM | POA: Diagnosis not present

## 2018-12-17 DIAGNOSIS — M5030 Other cervical disc degeneration, unspecified cervical region: Secondary | ICD-10-CM | POA: Diagnosis not present

## 2018-12-17 DIAGNOSIS — M47817 Spondylosis without myelopathy or radiculopathy, lumbosacral region: Secondary | ICD-10-CM | POA: Diagnosis not present
